# Patient Record
Sex: Female | Born: 1950 | Race: White | Hispanic: No | Marital: Married | Smoking: Former smoker
Health system: Southern US, Community
[De-identification: ages and names within clinical notes are randomized; demographics above are authoritative.]

## PROBLEM LIST (undated history)

## (undated) DIAGNOSIS — Z8489 Family history of other specified conditions: Secondary | ICD-10-CM

## (undated) DIAGNOSIS — Z8719 Personal history of other diseases of the digestive system: Secondary | ICD-10-CM

## (undated) DIAGNOSIS — K831 Obstruction of bile duct: Secondary | ICD-10-CM

## (undated) DIAGNOSIS — M1990 Unspecified osteoarthritis, unspecified site: Secondary | ICD-10-CM

## (undated) DIAGNOSIS — K219 Gastro-esophageal reflux disease without esophagitis: Secondary | ICD-10-CM

## (undated) DIAGNOSIS — K589 Irritable bowel syndrome without diarrhea: Secondary | ICD-10-CM

## (undated) HISTORY — PX: ABDOMINAL HYSTERECTOMY: SHX81

## (undated) HISTORY — PX: BACK SURGERY: SHX140

## (undated) HISTORY — PX: CHOLECYSTECTOMY: SHX55

---

## 2014-09-07 HISTORY — PX: JOINT REPLACEMENT: SHX530

## 2016-12-31 ENCOUNTER — Other Ambulatory Visit: Payer: Self-pay | Admitting: Orthopedic Surgery

## 2016-12-31 DIAGNOSIS — M7062 Trochanteric bursitis, left hip: Secondary | ICD-10-CM

## 2017-01-07 ENCOUNTER — Ambulatory Visit
Admission: RE | Admit: 2017-01-07 | Discharge: 2017-01-07 | Disposition: A | Discharge status: Home / Self Care | Payer: BLUE CROSS/BLUE SHIELD | Source: Ambulatory Visit | Attending: Orthopedic Surgery | Admitting: Orthopedic Surgery

## 2017-01-07 DIAGNOSIS — M7062 Trochanteric bursitis, left hip: Secondary | ICD-10-CM

## 2017-01-07 MED ORDER — METHYLPREDNISOLONE ACETATE 40 MG/ML INJ SUSP (RADIOLOG
120.0000 mg | Freq: Once | INTRAMUSCULAR | Status: AC
  Administered 2017-01-07: 120 mg via INTRA_ARTICULAR

## 2017-01-07 MED ORDER — DIAZEPAM 5 MG PO TABS
5.0000 mg | ORAL_TABLET | Freq: Once | ORAL | Status: AC
  Administered 2017-01-07: 5 mg via ORAL

## 2017-01-07 MED ORDER — IOPAMIDOL (ISOVUE-M 200) INJECTION 41%
1.0000 mL | Freq: Once | INTRAMUSCULAR | Status: AC
  Administered 2017-01-07: 1 mL via INTRA_ARTICULAR

## 2019-04-24 ENCOUNTER — Other Ambulatory Visit: Payer: Self-pay | Admitting: Orthopedic Surgery

## 2019-04-24 DIAGNOSIS — Z96642 Presence of left artificial hip joint: Secondary | ICD-10-CM

## 2019-04-28 ENCOUNTER — Other Ambulatory Visit: Payer: Self-pay

## 2019-04-28 ENCOUNTER — Encounter (HOSPITAL_COMMUNITY)
Admission: RE | Admit: 2019-04-28 | Discharge: 2019-04-28 | Disposition: A | Discharge status: Home / Self Care | Payer: Medicare Other | Source: Ambulatory Visit | Attending: Orthopedic Surgery | Admitting: Orthopedic Surgery

## 2019-04-28 DIAGNOSIS — Z96642 Presence of left artificial hip joint: Secondary | ICD-10-CM

## 2019-04-28 MED ORDER — TECHNETIUM TC 99M MEDRONATE IV KIT: 20.0000 | PACK | Freq: Once | INTRAVENOUS | Status: DC | PRN

## 2019-05-16 ENCOUNTER — Ambulatory Visit: Payer: Self-pay | Admitting: Orthopedic Surgery

## 2019-05-23 ENCOUNTER — Ambulatory Visit: Payer: Self-pay | Admitting: Orthopedic Surgery

## 2019-05-23 NOTE — H&P (View-Only) (Signed)
TOTAL HIP REVISION ADMISSION H&P  Patient is admitted for left revision total hip arthroplasty.  Subjective:  Chief Complaint: left hip pain  HPI: Gail Short, 68 y.o. female, has a history of pain and functional disability in the left hip due to arthritis and patient has failed non-surgical conservative treatments for greater than 12 weeks to include NSAID's and/or analgesics, flexibility and strengthening excercises, use of assistive devices and activity modification. The indications for the revision total hip arthroplasty are instability.  Onset of symptoms was abrupt starting 4 years ago with gradually worsening course since that time.  Prior procedures on the left hip include arthroplasty.  Patient currently rates pain in the left hip at 10 out of 10 with activity.  There is night pain, worsening of pain with activity and weight bearing, pain that interfers with activities of daily living and pain with passive range of motion.  This condition presents safety issues increasing the risk of falls.   There is no current active infection.  There are no active problems to display for this patient.  Past Medical History:  Diagnosis Date  . Arthritis    back  . Family history of adverse reaction to anesthesia    PONV  . GERD (gastroesophageal reflux disease)   . History of hiatal hernia   . Irritable bowel syndrome (IBS)   . Pancreatitis due to biliary obstruction     Past Surgical History:  Procedure Laterality Date  . ABDOMINAL HYSTERECTOMY    . BACK SURGERY     neck fusionsx2,[email or web address].lam L4-5,5-S1  . CHOLECYSTECTOMY    . JOINT REPLACEMENT Left 2016   hip    No current facility-administered medications for this visit.    No current outpatient medications on file.   Facility-Administered Medications Ordered in Other Visits  Medication Dose Route Frequency Provider Last Rate Last Dose  . 0.9 %  sodium chloride infusion   Intravenous Continuous Generoso Cropper, Aaron Edelman, MD      . acetaminophen  (OFIRMEV) 10 MG/ML IV           . acetaminophen (OFIRMEV) IV 1,000 mg  1,000 mg Intravenous To OR Torina Ey, Aaron Edelman, MD      . chlorhexidine (HIBICLENS) 4 % liquid 4 application  60 mL Topical Once Docia Klar, Aaron Edelman, MD      . chlorhexidine (HIBICLENS) 4 % liquid 4 application  60 mL Topical Once Paton Crum, Aaron Edelman, MD      . clindamycin (CLEOCIN) 900 MG/50ML IVPB           . clindamycin (CLEOCIN) IVPB 900 mg  900 mg Intravenous On Call to OR Javid Kemler, Aaron Edelman, MD      . dextrose 50 % solution           . lactated ringers infusion   Intravenous Continuous Catalina Gravel, MD 50 mL/hr at 05/31/19 0630    . lactated ringers infusion   Intravenous Continuous Brennan Bailey, MD 75 mL/hr at 05/31/19 [phone]    . povidone-iodine 10 % swab 2 application  2 application Topical Once Malaijah Houchen, Aaron Edelman, MD      . tranexamic acid (CYKLOKAPRON) 1000MG /149mL IVPB           . tranexamic acid (CYKLOKAPRON) IVPB 1,000 mg  1,000 mg Intravenous To OR Marketa Midkiff, Aaron Edelman, MD       Allergies  Allergen Reactions  . Penicillins Hives, Shortness Of Breath, Itching and Swelling    Did it involve swelling of the face/tongue/throat, SOB, or low BP? Yes Did it involve sudden  or severe rash/hives, skin peeling, or any reaction on the inside of your mouth or nose? No Did you need to seek medical attention at a hospital or doctor's office? Yes When did it last happen?30+ years ago If all above answers are "NO", may proceed with cephalosporin use.   . Meperidine And Related Other (See Comments)    Caused pt  violent reaction and loss of memory    Social History   Tobacco Use  . Smoking status: Former Smoker    Packs/day: 0.25    Years: 10.00    Pack years: 2.50    Types: Cigarettes    Quit date: 05/25/2009    Years since quitting: 10.0  . Smokeless tobacco: Never Used  Substance Use Topics  . Alcohol use: Never    Frequency: Never    No family history on file.    Review of Systems  Constitutional: Negative.    HENT: Negative.   Eyes: Negative.   Respiratory: Negative.   Cardiovascular: Negative.   Gastrointestinal: Positive for heartburn.  Genitourinary: Negative.   Musculoskeletal: Positive for joint pain.  Skin: Negative.   Endo/Heme/Allergies: Positive for environmental allergies.  Psychiatric/Behavioral: Negative.     Objective:  Physical Exam  Vitals reviewed. Constitutional: She is oriented to person, place, and time. She appears well-developed and well-nourished.  HENT:  Head: Normocephalic and atraumatic.  Eyes: Pupils are equal, round, and reactive to light. Conjunctivae and EOM are normal.  Neck: Normal range of motion. No thyromegaly present.  Cardiovascular: Normal rate, regular rhythm and intact distal pulses.  Respiratory: Effort normal. No respiratory distress.  GI: Soft. She exhibits no distension.  Genitourinary:    Genitourinary Comments: deferred   Musculoskeletal:     Left hip: She exhibits decreased range of motion.       Legs:  Neurological: She is alert and oriented to person, place, and time. She has normal reflexes.  Skin: Skin is warm and dry.  Psychiatric: She has a normal mood and affect. Her behavior is normal. Judgment and thought content normal.    Vital signs in last 24 hours: @VSRANGES @   Labs:   Estimated body mass index is 19.05 kg/m as calculated from the following:   Height as of 05/26/19: 5\' 6"  (1.676 m).   Weight as of 05/26/19: 53.5 kg.  Imaging Review:  Plain radiographs demonstrate severe degenerative joint disease of the left hip(s). There is evidence of vertical / anteverted inclination of the acetabular cup.The bone quality appears to be adequate for age and reported activity level.    Assessment/Plan:  End stage arthritis, left hip(s) with failed previous arthroplasty.  The patient history, physical examination, clinical judgement of the provider and imaging studies are consistent with end stage degenerative joint disease  of the left hip(s), previous total hip arthroplasty. Revision total hip arthroplasty is deemed medically necessary. The treatment options including medical management, injection therapy, arthroscopy and arthroplasty were discussed at length. The risks and benefits of total hip arthroplasty were presented and reviewed. The risks due to aseptic loosening, infection, stiffness, dislocation/subluxation,  thromboembolic complications and other imponderables were discussed.  The patient acknowledged the explanation, agreed to proceed with the plan and consent was signed. Patient is being admitted for inpatient treatment for surgery, pain control, PT, OT, prophylactic antibiotics, VTE prophylaxis, progressive ambulation and ADL's and discharge planning. The patient is planning to be discharged home with home health services

## 2019-05-23 NOTE — H&P (Signed)
TOTAL HIP REVISION ADMISSION H&P  Patient is admitted for left revision total hip arthroplasty.  Subjective:  Chief Complaint: left hip pain  HPI: Gail Short, 68 y.o. female, has a history of pain and functional disability in the left hip due to arthritis and patient has failed non-surgical conservative treatments for greater than 12 weeks to include NSAID's and/or analgesics, flexibility and strengthening excercises, use of assistive devices and activity modification. The indications for the revision total hip arthroplasty are instability.  Onset of symptoms was abrupt starting 4 years ago with gradually worsening course since that time.  Prior procedures on the left hip include arthroplasty.  Patient currently rates pain in the left hip at 10 out of 10 with activity.  There is night pain, worsening of pain with activity and weight bearing, pain that interfers with activities of daily living and pain with passive range of motion.  This condition presents safety issues increasing the risk of falls.   There is no current active infection.  There are no active problems to display for this patient.  Past Medical History:  Diagnosis Date  . Arthritis    back  . Family history of adverse reaction to anesthesia    PONV  . GERD (gastroesophageal reflux disease)   . History of hiatal hernia   . Irritable bowel syndrome (IBS)   . Pancreatitis due to biliary obstruction     Past Surgical History:  Procedure Laterality Date  . ABDOMINAL HYSTERECTOMY    . BACK SURGERY     neck fusionsx2,decom.lam L4-5,5-S1  . CHOLECYSTECTOMY    . JOINT REPLACEMENT Left 2016   hip    No current facility-administered medications for this visit.    No current outpatient medications on file.   Facility-Administered Medications Ordered in Other Visits  Medication Dose Route Frequency Provider Last Rate Last Dose  . 0.9 %  sodium chloride infusion   Intravenous Continuous Yecheskel Kurek, Aaron Edelman, MD      . acetaminophen  (OFIRMEV) 10 MG/ML IV           . acetaminophen (OFIRMEV) IV 1,000 mg  1,000 mg Intravenous To OR Maricarmen Braziel, Aaron Edelman, MD      . chlorhexidine (HIBICLENS) 4 % liquid 4 application  60 mL Topical Once Feather Berrie, Aaron Edelman, MD      . chlorhexidine (HIBICLENS) 4 % liquid 4 application  60 mL Topical Once Adison Jerger, Aaron Edelman, MD      . clindamycin (CLEOCIN) 900 MG/50ML IVPB           . clindamycin (CLEOCIN) IVPB 900 mg  900 mg Intravenous On Call to OR Maly Lemarr, Aaron Edelman, MD      . dextrose 50 % solution           . lactated ringers infusion   Intravenous Continuous Catalina Gravel, MD 50 mL/hr at 05/31/19 0630    . lactated ringers infusion   Intravenous Continuous Brennan Bailey, MD 75 mL/hr at 05/31/19 [phone]    . povidone-iodine 10 % swab 2 application  2 application Topical Once Blessyn Somm[place], Aaron Edelman, MD      . tranexamic acid (CYKLOKAPRON) 1000MG /149mL IVPB           . tranexamic acid (CYKLOKAPRON) IVPB 1,000 mg  1,000 mg Intravenous To OR Tipton Ballow, Aaron Edelman, MD       Allergies  Allergen Reactions  . Penicillins Hives, Shortness Of Breath, Itching and Swelling    Did it involve swelling of the face/tongue/throat, SOB, or low BP? Yes Did it involve sudden  or severe rash/hives, skin peeling, or any reaction on the inside of your mouth or nose? No Did you need to seek medical attention at a hospital or doctor's office? Yes When did it last happen?30+ years ago If all above answers are "NO", may proceed with cephalosporin use.   . Meperidine And Related Other (See Comments)    Caused pt  violent reaction and loss of memory    Social History   Tobacco Use  . Smoking status: Former Smoker    Packs/day: 0.25    Years: 10.00    Pack years: 2.50    Types: Cigarettes    Quit date: 05/25/2009    Years since quitting: 10.0  . Smokeless tobacco: Never Used  Substance Use Topics  . Alcohol use: Never    Frequency: Never    No family history on file.    Review of Systems  Constitutional: Negative.    HENT: Negative.   Eyes: Negative.   Respiratory: Negative.   Cardiovascular: Negative.   Gastrointestinal: Positive for heartburn.  Genitourinary: Negative.   Musculoskeletal: Positive for joint pain.  Skin: Negative.   Endo/Heme/Allergies: Positive for environmental allergies.  Psychiatric/Behavioral: Negative.     Objective:  Physical Exam  Vitals reviewed. Constitutional: She is oriented to person, place, and time. She appears well-developed and well-nourished.  HENT:  Head: Normocephalic and atraumatic.  Eyes: Pupils are equal, round, and reactive to light. Conjunctivae and EOM are normal.  Neck: Normal range of motion. No thyromegaly present.  Cardiovascular: Normal rate, regular rhythm and intact distal pulses.  Respiratory: Effort normal. No respiratory distress.  GI: Soft. She exhibits no distension.  Genitourinary:    Genitourinary Comments: deferred   Musculoskeletal:     Left hip: She exhibits decreased range of motion.       Legs:  Neurological: She is alert and oriented to person, place, and time. She has normal reflexes.  Skin: Skin is warm and dry.  Psychiatric: She has a normal mood and affect. Her behavior is normal. Judgment and thought content normal.    Vital signs in last 24 hours: @VSRANGES@   Labs:   Estimated body mass index is 19.05 kg/m as calculated from the following:   Height as of 05/26/19: 5' 6" (1.676 m).   Weight as of 05/26/19: 53.5 kg.  Imaging Review:  Plain radiographs demonstrate severe degenerative joint disease of the left hip(s). There is evidence of vertical / anteverted inclination of the acetabular cup.The bone quality appears to be adequate for age and reported activity level.    Assessment/Plan:  End stage arthritis, left hip(s) with failed previous arthroplasty.  The patient history, physical examination, clinical judgement of the provider and imaging studies are consistent with end stage degenerative joint disease  of the left hip(s), previous total hip arthroplasty. Revision total hip arthroplasty is deemed medically necessary. The treatment options including medical management, injection therapy, arthroscopy and arthroplasty were discussed at length. The risks and benefits of total hip arthroplasty were presented and reviewed. The risks due to aseptic loosening, infection, stiffness, dislocation/subluxation,  thromboembolic complications and other imponderables were discussed.  The patient acknowledged the explanation, agreed to proceed with the plan and consent was signed. Patient is being admitted for inpatient treatment for surgery, pain control, PT, OT, prophylactic antibiotics, VTE prophylaxis, progressive ambulation and ADL's and discharge planning. The patient is planning to be discharged home with home health services 

## 2019-05-24 NOTE — Patient Instructions (Addendum)
DUE TO COVID-19 ONLY ONE VISITOR IS ALLOWED TO COME WITH YOU AND STAY IN THE WAITING ROOM ONLY DURING PRE OP AND PROCEDURE DAY OF SURGERY.  THE 1 VISITOR MAY VISIT WITH YOU AFTER SURGERY IN YOUR PRIVATE ROOM DURING VISITING HOURS ONLY!     YOU NEED TO HAVE A COVID 19 TEST ON_Sat. 9/19______ @_10 :20______, THIS TEST MUST BE DONE BEFORE SURGERY, COME  [address], [place] , [id number].  ([place])  ONCE YOUR COVID TEST IS COMPLETED, PLEASE BEGIN THE QUARANTINE INSTRUCTIONS AS OUTLINED IN YOUR HANDOUT.                Estill Bamberg   Your procedure is scheduled on: Wednesday 05/31/19   Report to [facility] Main  Entrance   Report to Short Stay at 5:30 AM     Call this number if you have problems the morning of surgery Platter, NO CHEWING GUM CANDY OR MINTS.    Do not eat food After Midnight.   YOU MAY HAVE CLEAR LIQUIDS FROM MIDNIGHT UNTIL 4:30AM.  At 4:30AM Please finish the prescribed Pre-Surgery  drink.  Nothing by mouth after you finish the  drink !   Take these medicines the morning of surgery with A SIP OF WATER: none                                 You may not have any metal on your body including hair pins and              piercings              Do not wear jewelry, make-up, lotions, powders or perfumes, deodorant             Do not wear nail polish.  Do not shave  48 hours prior to surgery.            Do not bring valuables to the hospital. [place].  Contacts, dentures or bridgework may not be worn into surgery.        Name and phone number of your driver:  Special Instructions: N/A              Please read over the following fact sheets you were given: _____________________________________________________________________             [facility] - Preparing for Surgery  Before surgery, you can play an  important role.   Because skin is not sterile, your skin needs to be as free of germs as possible .  You can reduce the number of germs on your skin by washing with CHG (chlorahexidine gluconate) soap before surgery.   CHG is an antiseptic cleaner which kills germs and bonds with the skin to continue killing germs even after washing. Please DO NOT use if you have an allergy to CHG or antibacterial soaps.   If your skin becomes reddened/irritated stop using the CHG and inform your nurse when you arrive at Short Stay. Do not shave (including legs and underarms) for at least 48 hours prior to the first CHG shower.   Please follow these instructions carefully:  1.  Shower with CHG Soap the night before surgery and the  morning of  Surgery.  2.  If you choose to wash your hair, wash your hair first as usual with your  normal  shampoo.  3.  After you shampoo, rinse your hair and body thoroughly to remove the  shampoo.                                        4.  Use CHG as you would any other liquid soap.  You can apply chg directly  to the skin and wash                       Gently with a scrungie or clean washcloth.  5.  Apply the CHG Soap to your body ONLY FROM THE NECK DOWN.   Do not use on face/ open                           Wound or open sores. Avoid contact with eyes, ears mouth and genitals (private parts).                       Wash face,  Genitals (private parts) with your normal soap.             6.  Wash thoroughly, paying special attention to the area where your surgery  will be performed.  7.  Thoroughly rinse your body with warm water from the neck down.  8.  DO NOT shower/wash with your normal soap after using and rinsing off  the CHG Soap.             9.  Pat yourself dry with a clean towel.            10.  Wear clean pajamas.            11.  Place clean sheets on your bed the night of your first shower and do not  sleep with pets. Day of Surgery : Do not apply any lotions/deodorants  the morning of surgery.  Please wear clean clothes to the hospital/surgery center.  FAILURE TO FOLLOW THESE INSTRUCTIONS MAY RESULT IN THE CANCELLATION OF YOUR SURGERY PATIENT SIGNATURE_________________________________  NURSE SIGNATURE__________________________________  ________________________________________________________________________   Rogelia MireIncentive Spirometer  An incentive spirometer is a tool that can help keep your lungs clear and active. This tool measures how well you are filling your lungs with each breath. Taking long deep breaths may help reverse or decrease the chance of developing breathing (pulmonary) problems (especially infection) following:  A long period of time when you are unable to move or be active. BEFORE THE PROCEDURE   If the spirometer includes an indicator to show your best effort, your nurse or respiratory therapist will set it to a desired goal.  If possible, sit up straight or lean slightly forward. Try not to slouch.  Hold the incentive spirometer in an upright position. INSTRUCTIONS FOR USE  1. Sit on the edge of your bed if possible, or sit up as far as you can in bed or on a chair. 2. Hold the incentive spirometer in an upright position. 3. Breathe out normally. 4. Place the mouthpiece in your mouth and seal your lips tightly around it. 5. Breathe in slowly and as deeply as possible, raising the piston or the ball toward the top of the column. 6. Hold your breath for  3-5 seconds or for as long as possible. Allow the piston or ball to fall to the bottom of the column. 7. Remove the mouthpiece from your mouth and breathe out normally. 8. Rest for a few seconds and repeat Steps 1 through 7 at least 10 times every 1-2 hours when you are awake. Take your time and take a few normal breaths between deep breaths. 9. The spirometer may include an indicator to show your best effort. Use the indicator as a goal to work toward during each repetition. 10. After  each set of 10 deep breaths, practice coughing to be sure your lungs are clear. If you have an incision (the cut made at the time of surgery), support your incision when coughing by placing a pillow or rolled up towels firmly against it. Once you are able to get out of bed, walk around indoors and cough well. You may stop using the incentive spirometer when instructed by your caregiver.  RISKS AND COMPLICATIONS  Take your time so you do not get dizzy or light-headed.  If you are in pain, you may need to take or ask for pain medication before doing incentive spirometry. It is harder to take a deep breath if you are having pain. AFTER USE  Rest and breathe slowly and easily.  It can be helpful to keep track of a log of your progress. Your caregiver can provide you with a simple table to help with this. If you are using the spirometer at home, follow these instructions: SEEK MEDICAL CARE IF:   You are having difficultly using the spirometer.  You have trouble using the spirometer as often as instructed.  Your pain medication is not giving enough relief while using the spirometer.  You develop fever of 100.5 F (38.1 C) or higher. SEEK IMMEDIATE MEDICAL CARE IF:   You cough up bloody sputum that had not been present before.  You develop fever of 102 F (38.9 C) or greater.  You develop worsening pain at or near the incision site. MAKE SURE YOU:   Understand these instructions.  Will watch your condition.  Will get help right away if you are not doing well or get worse. Document Released: 01/04/2007 Document Revised: 11/16/2011 Document Reviewed: 03/07/2007 ExitCare Patient Information 2014 ExitCare, [place].   ________________________________________________________________________  WHAT IS A BLOOD TRANSFUSION? Blood Transfusion Information  A transfusion is the replacement of blood or some of its parts. Blood is made up of multiple cells which provide different  functions.  Red blood cells carry oxygen and are used for blood loss replacement.  White blood cells fight against infection.  Platelets control bleeding.  Plasma helps clot blood.  Other blood products are available for specialized needs, such as hemophilia or other clotting disorders. BEFORE THE TRANSFUSION  Who gives blood for transfusions?   Healthy volunteers who are fully evaluated to make sure their blood is safe. This is blood bank blood. Transfusion therapy is the safest it has ever been in the practice of medicine. Before blood is taken from a donor, a complete history is taken to make sure that person has no history of diseases nor engages in risky social behavior (examples are intravenous drug use or sexual activity with multiple partners). The donor's travel history is screened to minimize risk of transmitting infections, such as malaria. The donated blood is tested for signs of infectious diseases, such as HIV and hepatitis. The blood is then tested to be sure it is compatible with you in  order to minimize the chance of a transfusion reaction. If you or a relative donates blood, this is often done in anticipation of surgery and is not appropriate for emergency situations. It takes many days to process the donated blood. RISKS AND COMPLICATIONS Although transfusion therapy is very safe and saves many lives, the main dangers of transfusion include:   Getting an infectious disease.  Developing a transfusion reaction. This is an allergic reaction to something in the blood you were given. Every precaution is taken to prevent this. The decision to have a blood transfusion has been considered carefully by your caregiver before blood is given. Blood is not given unless the benefits outweigh the risks. AFTER THE TRANSFUSION  Right after receiving a blood transfusion, you will usually feel much better and more energetic. This is especially true if your red blood cells have gotten low  (anemic). The transfusion raises the level of the red blood cells which carry oxygen, and this usually causes an energy increase.  The nurse administering the transfusion will monitor you carefully for complications. HOME CARE INSTRUCTIONS  No special instructions are needed after a transfusion. You may find your energy is better. Speak with your caregiver about any limitations on activity for underlying diseases you may have. SEEK MEDICAL CARE IF:   Your condition is not improving after your transfusion.  You develop redness or irritation at the intravenous (IV) site. SEEK IMMEDIATE MEDICAL CARE IF:  Any of the following symptoms occur over the next 12 hours:  Shaking chills.  You have a temperature by mouth above 102 F (38.9 C), not controlled by medicine.  Chest, back, or muscle pain.  People around you feel you are not acting correctly or are confused.  Shortness of breath or difficulty breathing.  Dizziness and fainting.  You get a rash or develop hives.  You have a decrease in urine output.  Your urine turns a dark color or changes to pink, red, or brown. Any of the following symptoms occur over the next 10 days:  You have a temperature by mouth above 102 F (38.9 C), not controlled by medicine.  Shortness of breath.  Weakness after normal activity.  The white part of the eye turns yellow (jaundice).  You have a decrease in the amount of urine or are urinating less often.  Your urine turns a dark color or changes to pink, red, or brown. Document Released: 08/21/2000 Document Revised: 11/16/2011 Document Reviewed: 04/09/2008 [facility] LLCExitCare Patient Information 2014 [place], [place].  _______________________________________________________________________

## 2019-05-26 ENCOUNTER — Encounter (HOSPITAL_COMMUNITY)
Admission: RE | Admit: 2019-05-26 | Discharge: 2019-05-26 | Disposition: A | Discharge status: Home / Self Care | Payer: Medicare Other | Source: Ambulatory Visit | Attending: Orthopedic Surgery | Admitting: Orthopedic Surgery

## 2019-05-26 ENCOUNTER — Other Ambulatory Visit: Payer: Self-pay

## 2019-05-26 ENCOUNTER — Encounter (INDEPENDENT_AMBULATORY_CARE_PROVIDER_SITE_OTHER): Payer: Self-pay

## 2019-05-26 ENCOUNTER — Encounter (HOSPITAL_COMMUNITY): Payer: Self-pay

## 2019-05-26 DIAGNOSIS — Z01812 Encounter for preprocedural laboratory examination: Secondary | ICD-10-CM | POA: Diagnosis not present

## 2019-05-26 HISTORY — DX: Gastro-esophageal reflux disease without esophagitis: K21.9

## 2019-05-26 HISTORY — DX: Irritable bowel syndrome without diarrhea: K58.9

## 2019-05-26 HISTORY — DX: Unspecified osteoarthritis, unspecified site: M19.90

## 2019-05-26 HISTORY — DX: Obstruction of bile duct: K83.1

## 2019-05-26 HISTORY — DX: Personal history of other diseases of the digestive system: Z87.19

## 2019-05-26 HISTORY — DX: Family history of other specified conditions: Z84.89

## 2019-05-26 LAB — COMPREHENSIVE METABOLIC PANEL
ALT: 12 U/L (ref 0–44)
AST: 25 U/L (ref 15–41)
Albumin: 4.1 g/dL (ref 3.5–5.0)
Alkaline Phosphatase: 64 U/L (ref 38–126)
Anion gap: 9 (ref 5–15)
BUN: 10 mg/dL (ref 8–23)
CO2: 28 mmol/L (ref 22–32)
Calcium: 9.4 mg/dL (ref 8.9–10.3)
Chloride: 100 mmol/L (ref 98–111)
Creatinine, Ser: 0.55 mg/dL (ref 0.44–1.00)
GFR calc Af Amer: >60 mL/min (ref >60)
GFR calc non Af Amer: >60 mL/min (ref >60)
Glucose, Bld: 84 mg/dL (ref 70–99)
Potassium: 5.8 mmol/L — ABNORMAL HIGH (ref 3.5–5.1)
Sodium: 137 mmol/L (ref 135–145)
Total Bilirubin: 0.4 mg/dL (ref 0.3–1.2)
Total Protein: 7 g/dL (ref 6.5–8.1)

## 2019-05-26 LAB — URINALYSIS, ROUTINE W REFLEX MICROSCOPIC
Bilirubin Urine: NEGATIVE
Glucose, UA: NEGATIVE mg/dL
Hgb urine dipstick: NEGATIVE
Ketones, ur: 5 mg/dL — AB
Leukocytes,Ua: NEGATIVE
Nitrite: NEGATIVE
Protein, ur: NEGATIVE mg/dL
Specific Gravity, Urine: 1.015 (ref 1.005–1.030)
pH: 5 (ref 5.0–8.0)

## 2019-05-26 LAB — CBC
HCT: 38.5 % (ref 36.0–46.0)
Hemoglobin: 12.4 g/dL (ref 12.0–15.0)
MCH: 33.1 pg (ref 26.0–34.0)
MCHC: 32.2 g/dL (ref 30.0–36.0)
MCV: 102.7 fL — ABNORMAL HIGH (ref 80.0–100.0)
Platelets: 285 10*3/uL (ref 150–400)
RBC: 3.75 MIL/uL — ABNORMAL LOW (ref 3.87–5.11)
RDW: 12.9 % (ref 11.5–15.5)
WBC: 6.2 10*3/uL (ref 4.0–10.5)
nRBC: 0 % (ref 0.0–0.2)

## 2019-05-26 LAB — PROTIME-INR
INR: 0.9 (ref 0.8–1.2)
Prothrombin Time: 12.4 seconds (ref 11.4–15.2)

## 2019-05-26 LAB — ABO/RH: ABO/RH(D): O POS

## 2019-05-26 LAB — SURGICAL PCR SCREEN
MRSA, PCR: NEGATIVE
Staphylococcus aureus: NEGATIVE

## 2019-05-26 NOTE — Progress Notes (Addendum)
PCP - Dr Kellie Shropshire Cardiologist - none  Chest x-ray - no EKG - no Stress Test - no ECHO - no Cardiac Cath - no  Sleep Study - no CPAP - no  Fasting Blood Sugar - NA Checks Blood Sugar _____ times a day  Blood Thinner Instructions:NA Aspirin Instructions: Last Dose:supplements stopped 05/19/19  Anesthesia review:   Patient denies shortness of breath, fever, cough and chest pain at PAT appointment yes  Patient verbalized understanding of instructions that were given to them at the PAT appointment. Patient was also instructed that they will need to review over the PAT instructions again at home before surgery. Yes  K+ was 5.8 at PAT visit . Chart given to [facility] .

## 2019-05-27 ENCOUNTER — Other Ambulatory Visit (HOSPITAL_COMMUNITY)
Admission: RE | Admit: 2019-05-27 | Discharge: 2019-05-27 | Disposition: A | Discharge status: Home / Self Care | Payer: Medicare Other | Source: Ambulatory Visit | Attending: Orthopedic Surgery | Admitting: Orthopedic Surgery

## 2019-05-27 DIAGNOSIS — Z01812 Encounter for preprocedural laboratory examination: Secondary | ICD-10-CM | POA: Insufficient documentation

## 2019-05-27 DIAGNOSIS — Z20828 Contact with and (suspected) exposure to other viral communicable diseases: Secondary | ICD-10-CM | POA: Insufficient documentation

## 2019-05-28 LAB — NOVEL CORONAVIRUS, NAA (HOSP ORDER, SEND-OUT TO REF LAB; TAT 18-24 HRS): SARS-CoV-2, NAA: NOT DETECTED

## 2019-05-30 NOTE — Anesthesia Preprocedure Evaluation (Addendum)
Anesthesia Evaluation  Patient identified by MRN, date of birth, ID band Patient awake    Reviewed: Allergy & Precautions, NPO status , Patient's Chart, lab work & pertinent test results  History of Anesthesia Complications Negative for: history of anesthetic complications  Airway Mallampati: II  TM Distance: <3 FB Neck ROM: Full    Dental  (+) Missing,    Pulmonary former smoker,    Pulmonary exam normal        Cardiovascular negative cardio ROS Normal cardiovascular exam     Neuro/Psych negative neurological ROS  negative psych ROS   GI/Hepatic Neg liver ROS, hiatal hernia, GERD  Controlled,  Endo/Other  negative endocrine ROS  Renal/GU negative Renal ROS  negative genitourinary   Musculoskeletal  (+) Arthritis , Failed left THA   Abdominal   Peds  Hematology negative hematology ROS (+)   Anesthesia Other Findings Day of surgery medications reviewed with patient.  Reproductive/Obstetrics negative OB ROS                            Anesthesia Physical Anesthesia Plan  ASA: II  Anesthesia Plan: General   Post-op Pain Management:    Induction: Intravenous  PONV Risk Score and Plan: 4 or greater and Treatment may vary due to age or medical condition, Ondansetron, Dexamethasone and Midazolam  Airway Management Planned: Oral ETT  Additional Equipment: None  Intra-op Plan:   Post-operative Plan: Extubation in OR  Informed Consent: I have reviewed the patients History and Physical, chart, labs and discussed the procedure including the risks, benefits and alternatives for the proposed anesthesia with the patient or authorized representative who has indicated his/her understanding and acceptance.     Dental advisory given  Plan Discussed with: CRNA  Anesthesia Plan Comments:        Anesthesia Quick Evaluation

## 2019-05-31 ENCOUNTER — Other Ambulatory Visit: Payer: Self-pay

## 2019-05-31 ENCOUNTER — Inpatient Hospital Stay (HOSPITAL_COMMUNITY): Payer: Medicare Other | Admitting: Physician Assistant

## 2019-05-31 ENCOUNTER — Encounter (HOSPITAL_COMMUNITY)
Admission: RE | Disposition: A | Discharge status: Home Health | Payer: Self-pay | Source: Home / Self Care | Attending: Orthopedic Surgery

## 2019-05-31 ENCOUNTER — Inpatient Hospital Stay (HOSPITAL_COMMUNITY): Payer: Medicare Other | Admitting: Certified Registered Nurse Anesthetist

## 2019-05-31 ENCOUNTER — Inpatient Hospital Stay (HOSPITAL_COMMUNITY): Payer: Medicare Other

## 2019-05-31 ENCOUNTER — Encounter (HOSPITAL_COMMUNITY): Payer: Self-pay

## 2019-05-31 ENCOUNTER — Inpatient Hospital Stay (HOSPITAL_COMMUNITY)
Admission: RE | Admit: 2019-05-31 | Discharge: 2019-06-02 | DRG: 468 | Disposition: A | Discharge status: Home Health | Payer: Medicare Other | Attending: Orthopedic Surgery | Admitting: Orthopedic Surgery

## 2019-05-31 DIAGNOSIS — T84091A Other mechanical complication of internal left hip prosthesis, initial encounter: Secondary | ICD-10-CM | POA: Diagnosis not present

## 2019-05-31 DIAGNOSIS — Y792 Prosthetic and other implants, materials and accessory orthopedic devices associated with adverse incidents: Secondary | ICD-10-CM | POA: Diagnosis present

## 2019-05-31 DIAGNOSIS — T84018A Broken internal joint prosthesis, other site, initial encounter: Secondary | ICD-10-CM

## 2019-05-31 DIAGNOSIS — Z96649 Presence of unspecified artificial hip joint: Secondary | ICD-10-CM

## 2019-05-31 DIAGNOSIS — K589 Irritable bowel syndrome without diarrhea: Secondary | ICD-10-CM | POA: Diagnosis not present

## 2019-05-31 DIAGNOSIS — Z87891 Personal history of nicotine dependence: Secondary | ICD-10-CM | POA: Diagnosis not present

## 2019-05-31 DIAGNOSIS — Z880 Allergy status to penicillin: Secondary | ICD-10-CM | POA: Diagnosis not present

## 2019-05-31 DIAGNOSIS — Z90710 Acquired absence of both cervix and uterus: Secondary | ICD-10-CM | POA: Diagnosis not present

## 2019-05-31 DIAGNOSIS — M1612 Unilateral primary osteoarthritis, left hip: Secondary | ICD-10-CM | POA: Diagnosis not present

## 2019-05-31 DIAGNOSIS — Z419 Encounter for procedure for purposes other than remedying health state, unspecified: Secondary | ICD-10-CM

## 2019-05-31 DIAGNOSIS — Z09 Encounter for follow-up examination after completed treatment for conditions other than malignant neoplasm: Secondary | ICD-10-CM

## 2019-05-31 DIAGNOSIS — K219 Gastro-esophageal reflux disease without esophagitis: Secondary | ICD-10-CM | POA: Diagnosis present

## 2019-05-31 HISTORY — PX: TOTAL HIP REVISION: SHX763

## 2019-05-31 LAB — TYPE AND SCREEN
ABO/RH(D): O POS
Antibody Screen: NEGATIVE

## 2019-05-31 LAB — BASIC METABOLIC PANEL
Anion gap: 6 (ref 5–15)
BUN: 8 mg/dL (ref 8–23)
CO2: 26 mmol/L (ref 22–32)
Calcium: 9 mg/dL (ref 8.9–10.3)
Chloride: 101 mmol/L (ref 98–111)
Creatinine, Ser: 0.45 mg/dL (ref 0.44–1.00)
GFR calc Af Amer: >60 mL/min (ref >60)
GFR calc non Af Amer: >60 mL/min (ref >60)
Glucose, Bld: 53 mg/dL — ABNORMAL LOW (ref 70–99)
Potassium: 4 mmol/L (ref 3.5–5.1)
Sodium: 133 mmol/L — ABNORMAL LOW (ref 135–145)

## 2019-05-31 LAB — GLUCOSE, CAPILLARY: Glucose-Capillary: 158 mg/dL — ABNORMAL HIGH (ref 70–99)

## 2019-05-31 SURGERY — TOTAL HIP REVISION
Anesthesia: General | Site: Hip | Laterality: Left

## 2019-05-31 MED ORDER — MENTHOL 3 MG MT LOZG: 1.0000 | LOZENGE | OROMUCOSAL | Status: DC | PRN

## 2019-05-31 MED ORDER — BUPIVACAINE-EPINEPHRINE 0.5% -1:200000 IJ SOLN
INTRAMUSCULAR | Status: AC
  Filled 2019-05-31: qty 1

## 2019-05-31 MED ORDER — VANCOMYCIN HCL IN DEXTROSE 1-5 GM/200ML-% IV SOLN
1000.0000 mg | Freq: Two times a day (BID) | INTRAVENOUS | Status: AC
  Administered 2019-05-31: 18:00:00 1000 mg via INTRAVENOUS
  Filled 2019-05-31: qty 200

## 2019-05-31 MED ORDER — PROMETHAZINE HCL 25 MG/ML IJ SOLN
6.2500 mg | INTRAMUSCULAR | Status: DC | PRN
  Administered 2019-05-31: 6.25 mg via INTRAVENOUS

## 2019-05-31 MED ORDER — PHENYLEPHRINE 40 MCG/ML (10ML) SYRINGE FOR IV PUSH (FOR BLOOD PRESSURE SUPPORT)
PREFILLED_SYRINGE | INTRAVENOUS | Status: DC | PRN
  Administered 2019-05-31: 80 ug via INTRAVENOUS
  Administered 2019-05-31: 120 ug via INTRAVENOUS
  Administered 2019-05-31 (×2): 80 ug via INTRAVENOUS
  Administered 2019-05-31: 120 ug via INTRAVENOUS

## 2019-05-31 MED ORDER — ALUM & MAG HYDROXIDE-SIMETH 200-200-20 MG/5ML PO SUSP: 30.0000 mL | ORAL | Status: DC | PRN

## 2019-05-31 MED ORDER — MECLIZINE HCL 25 MG PO TABS: 25.0000 mg | ORAL_TABLET | Freq: Three times a day (TID) | ORAL | Status: DC | PRN

## 2019-05-31 MED ORDER — SENNA 8.6 MG PO TABS
1.0000 | ORAL_TABLET | Freq: Two times a day (BID) | ORAL | Status: DC
  Administered 2019-05-31 – 2019-06-02 (×4): 8.6 mg via ORAL
  Filled 2019-05-31 (×5): qty 1

## 2019-05-31 MED ORDER — METHOCARBAMOL 500 MG IVPB - SIMPLE MED
500.0000 mg | Freq: Four times a day (QID) | INTRAVENOUS | Status: DC | PRN
  Administered 2019-05-31: 500 mg via INTRAVENOUS
  Filled 2019-05-31: qty 50

## 2019-05-31 MED ORDER — OXYCODONE HCL 5 MG PO TABS
ORAL_TABLET | ORAL | Status: AC
  Filled 2019-05-31: qty 1

## 2019-05-31 MED ORDER — BIOTIN 1000 MCG PO TABS: 1000.0000 ug | ORAL_TABLET | Freq: Every day | ORAL | Status: DC

## 2019-05-31 MED ORDER — SODIUM CHLORIDE 0.9 % IR SOLN
Status: DC | PRN
  Administered 2019-05-31: 1000 mL
  Administered 2019-05-31: 3000 mL
  Administered 2019-05-31: 1000 mL

## 2019-05-31 MED ORDER — PHENOL 1.4 % MT LIQD: 1.0000 | OROMUCOSAL | Status: DC | PRN

## 2019-05-31 MED ORDER — POVIDONE-IODINE 10 % EX SWAB: 2.0000 "application " | Freq: Once | CUTANEOUS | Status: DC

## 2019-05-31 MED ORDER — TRANEXAMIC ACID-NACL 1000-0.7 MG/100ML-% IV SOLN
1000.0000 mg | INTRAVENOUS | Status: AC
  Administered 2019-05-31: 1000 mg via INTRAVENOUS

## 2019-05-31 MED ORDER — DEXAMETHASONE SODIUM PHOSPHATE 10 MG/ML IJ SOLN
10.0000 mg | Freq: Once | INTRAMUSCULAR | Status: AC
  Administered 2019-06-01: 10 mg via INTRAVENOUS
  Filled 2019-05-31: qty 1

## 2019-05-31 MED ORDER — PHENYLEPHRINE 40 MCG/ML (10ML) SYRINGE FOR IV PUSH (FOR BLOOD PRESSURE SUPPORT)
PREFILLED_SYRINGE | INTRAVENOUS | Status: AC
  Filled 2019-05-31: qty 10

## 2019-05-31 MED ORDER — LACTATED RINGERS IV SOLN
INTRAVENOUS | Status: DC
  Administered 2019-05-31: 07:00:00 via INTRAVENOUS

## 2019-05-31 MED ORDER — CHLORHEXIDINE GLUCONATE 4 % EX LIQD: 60.0000 mL | Freq: Once | CUTANEOUS | Status: DC

## 2019-05-31 MED ORDER — MIDAZOLAM HCL 5 MG/5ML IJ SOLN
INTRAMUSCULAR | Status: DC | PRN
  Administered 2019-05-31 (×2): 1 mg via INTRAVENOUS

## 2019-05-31 MED ORDER — SODIUM CHLORIDE 0.9 % IV SOLN
INTRAVENOUS | Status: DC | PRN
  Administered 2019-05-31: 20 ug/min via INTRAVENOUS

## 2019-05-31 MED ORDER — FENTANYL CITRATE (PF) 250 MCG/5ML IJ SOLN
INTRAMUSCULAR | Status: AC
  Filled 2019-05-31: qty 5

## 2019-05-31 MED ORDER — DOCUSATE SODIUM 100 MG PO CAPS
100.0000 mg | ORAL_CAPSULE | Freq: Two times a day (BID) | ORAL | Status: DC
  Administered 2019-05-31 – 2019-06-02 (×4): 100 mg via ORAL
  Filled 2019-05-31 (×5): qty 1

## 2019-05-31 MED ORDER — TRANEXAMIC ACID-NACL 1000-0.7 MG/100ML-% IV SOLN
INTRAVENOUS | Status: AC
  Filled 2019-05-31: qty 100

## 2019-05-31 MED ORDER — PROPOFOL 10 MG/ML IV BOLUS
INTRAVENOUS | Status: DC | PRN
  Administered 2019-05-31: 100 mg via INTRAVENOUS

## 2019-05-31 MED ORDER — LIDOCAINE 2% (20 MG/ML) 5 ML SYRINGE
INTRAMUSCULAR | Status: DC | PRN
  Administered 2019-05-31: 20 mg via INTRAVENOUS
  Administered 2019-05-31: 40 mg via INTRAVENOUS

## 2019-05-31 MED ORDER — VANCOMYCIN HCL IN DEXTROSE 1-5 GM/200ML-% IV SOLN
1000.0000 mg | INTRAVENOUS | Status: AC
  Administered 2019-05-31: 07:00:00 1000 mg via INTRAVENOUS

## 2019-05-31 MED ORDER — PROMETHAZINE HCL 25 MG/ML IJ SOLN
INTRAMUSCULAR | Status: AC
  Filled 2019-05-31: qty 1

## 2019-05-31 MED ORDER — HYDROMORPHONE HCL 1 MG/ML IJ SOLN
INTRAMUSCULAR | Status: AC
  Administered 2019-05-31: 0.5 mg via INTRAVENOUS
  Filled 2019-05-31: qty 1

## 2019-05-31 MED ORDER — ACETAMINOPHEN 325 MG PO TABS: 325.0000 mg | ORAL_TABLET | Freq: Four times a day (QID) | ORAL | Status: DC | PRN

## 2019-05-31 MED ORDER — HYDROCODONE-ACETAMINOPHEN 7.5-325 MG PO TABS: 1.0000 | ORAL_TABLET | ORAL | Status: DC | PRN

## 2019-05-31 MED ORDER — CLINDAMYCIN PHOSPHATE 900 MG/50ML IV SOLN
900.0000 mg | INTRAVENOUS | Status: AC
  Administered 2019-05-31: 900 mg via INTRAVENOUS

## 2019-05-31 MED ORDER — DEXAMETHASONE SODIUM PHOSPHATE 10 MG/ML IJ SOLN
INTRAMUSCULAR | Status: AC
  Filled 2019-05-31: qty 1

## 2019-05-31 MED ORDER — SUGAMMADEX SODIUM 200 MG/2ML IV SOLN
INTRAVENOUS | Status: DC | PRN
  Administered 2019-05-31: 150 mg via INTRAVENOUS

## 2019-05-31 MED ORDER — ACETAMINOPHEN 10 MG/ML IV SOLN
INTRAVENOUS | Status: AC
  Filled 2019-05-31: qty 100

## 2019-05-31 MED ORDER — METHOCARBAMOL 500 MG PO TABS
500.0000 mg | ORAL_TABLET | Freq: Four times a day (QID) | ORAL | Status: DC | PRN
  Administered 2019-06-01 – 2019-06-02 (×4): 500 mg via ORAL
  Filled 2019-05-31 (×5): qty 1

## 2019-05-31 MED ORDER — KETOROLAC TROMETHAMINE 30 MG/ML IJ SOLN
INTRAMUSCULAR | Status: AC
  Filled 2019-05-31: qty 1

## 2019-05-31 MED ORDER — ACETAMINOPHEN 500 MG PO TABS
1000.0000 mg | ORAL_TABLET | Freq: Once | ORAL | Status: AC
  Administered 2019-05-31: 1000 mg via ORAL
  Filled 2019-05-31: qty 2

## 2019-05-31 MED ORDER — SODIUM CHLORIDE 0.9 % IV SOLN
INTRAVENOUS | Status: DC
  Administered 2019-05-31: 1000 mL via INTRAVENOUS
  Administered 2019-05-31 – 2019-06-01 (×2): via INTRAVENOUS

## 2019-05-31 MED ORDER — SODIUM CHLORIDE 0.9 % IV SOLN: INTRAVENOUS | Status: DC

## 2019-05-31 MED ORDER — ONDANSETRON HCL 4 MG/2ML IJ SOLN
INTRAMUSCULAR | Status: DC | PRN
  Administered 2019-05-31: 4 mg via INTRAVENOUS

## 2019-05-31 MED ORDER — METOCLOPRAMIDE HCL 5 MG/ML IJ SOLN: 5.0000 mg | Freq: Three times a day (TID) | INTRAMUSCULAR | Status: DC | PRN

## 2019-05-31 MED ORDER — SODIUM CHLORIDE 0.9% FLUSH
INTRAVENOUS | Status: DC | PRN
  Administered 2019-05-31: 30 mL

## 2019-05-31 MED ORDER — DIPHENHYDRAMINE HCL 12.5 MG/5ML PO ELIX: 12.5000 mg | ORAL_SOLUTION | ORAL | Status: DC | PRN

## 2019-05-31 MED ORDER — ISOPROPYL ALCOHOL SOLN
Status: DC | PRN
  Administered 2019-05-31: 100 mL

## 2019-05-31 MED ORDER — METOCLOPRAMIDE HCL 5 MG PO TABS: 5.0000 mg | ORAL_TABLET | Freq: Three times a day (TID) | ORAL | Status: DC | PRN

## 2019-05-31 MED ORDER — CLINDAMYCIN PHOSPHATE 900 MG/50ML IV SOLN
INTRAVENOUS | Status: AC
  Filled 2019-05-31: qty 50

## 2019-05-31 MED ORDER — CELECOXIB 200 MG PO CAPS
200.0000 mg | ORAL_CAPSULE | Freq: Two times a day (BID) | ORAL | Status: DC
  Administered 2019-05-31 – 2019-06-01 (×2): 200 mg via ORAL
  Filled 2019-05-31 (×4): qty 1

## 2019-05-31 MED ORDER — KETOROLAC TROMETHAMINE 30 MG/ML IJ SOLN
INTRAMUSCULAR | Status: DC | PRN
  Administered 2019-05-31: 30 mg

## 2019-05-31 MED ORDER — MORPHINE SULFATE (PF) 2 MG/ML IV SOLN
0.5000 mg | INTRAVENOUS | Status: DC | PRN
  Administered 2019-06-01: 0.5 mg via INTRAVENOUS
  Filled 2019-05-31: qty 1

## 2019-05-31 MED ORDER — POVIDONE-IODINE 10 % EX SWAB
2.0000 "application " | Freq: Once | CUTANEOUS | Status: AC
  Administered 2019-05-31: 2 via TOPICAL

## 2019-05-31 MED ORDER — HYDROCODONE-ACETAMINOPHEN 5-325 MG PO TABS
1.0000 | ORAL_TABLET | ORAL | Status: DC | PRN
  Administered 2019-05-31 – 2019-06-02 (×8): 2 via ORAL
  Filled 2019-05-31 (×9): qty 2

## 2019-05-31 MED ORDER — ONDANSETRON HCL 4 MG/2ML IJ SOLN
INTRAMUSCULAR | Status: AC
  Filled 2019-05-31: qty 2

## 2019-05-31 MED ORDER — AMITRIPTYLINE HCL 25 MG PO TABS
25.0000 mg | ORAL_TABLET | Freq: Every evening | ORAL | Status: DC | PRN
  Administered 2019-06-01: 23:00:00 25 mg via ORAL
  Filled 2019-05-31 (×2): qty 1

## 2019-05-31 MED ORDER — OXYCODONE HCL 5 MG/5ML PO SOLN: 5.0000 mg | Freq: Once | ORAL | Status: AC | PRN

## 2019-05-31 MED ORDER — ISOPROPYL ALCOHOL 70 % SOLN
Status: AC
  Filled 2019-05-31: qty 480

## 2019-05-31 MED ORDER — ROCURONIUM BROMIDE 50 MG/5ML IV SOSY
PREFILLED_SYRINGE | INTRAVENOUS | Status: DC | PRN
  Administered 2019-05-31: 10 mg via INTRAVENOUS
  Administered 2019-05-31: 60 mg via INTRAVENOUS

## 2019-05-31 MED ORDER — DEXTROSE 50 % IV SOLN
25.0000 mL | Freq: Once | INTRAVENOUS | Status: AC
  Administered 2019-05-31: 25 mL via INTRAVENOUS

## 2019-05-31 MED ORDER — ROCURONIUM BROMIDE 10 MG/ML (PF) SYRINGE
PREFILLED_SYRINGE | INTRAVENOUS | Status: AC
  Filled 2019-05-31: qty 10

## 2019-05-31 MED ORDER — POLYETHYLENE GLYCOL 3350 17 G PO PACK: 17.0000 g | PACK | Freq: Every day | ORAL | Status: DC | PRN

## 2019-05-31 MED ORDER — BUPIVACAINE-EPINEPHRINE 0.25% -1:200000 IJ SOLN
INTRAMUSCULAR | Status: DC | PRN
  Administered 2019-05-31: 30 mL

## 2019-05-31 MED ORDER — DEXAMETHASONE SODIUM PHOSPHATE 10 MG/ML IJ SOLN
INTRAMUSCULAR | Status: DC | PRN
  Administered 2019-05-31: 8 mg via INTRAVENOUS

## 2019-05-31 MED ORDER — APIXABAN 2.5 MG PO TABS
2.5000 mg | ORAL_TABLET | Freq: Two times a day (BID) | ORAL | Status: DC
  Administered 2019-06-01 – 2019-06-02 (×3): 2.5 mg via ORAL
  Filled 2019-05-31 (×3): qty 1

## 2019-05-31 MED ORDER — VANCOMYCIN HCL IN DEXTROSE 1-5 GM/200ML-% IV SOLN
INTRAVENOUS | Status: AC
  Administered 2019-05-31: 1000 mg via INTRAVENOUS
  Filled 2019-05-31: qty 200

## 2019-05-31 MED ORDER — HYDROMORPHONE HCL 1 MG/ML IJ SOLN
0.2500 mg | INTRAMUSCULAR | Status: DC | PRN
  Administered 2019-05-31 (×3): 0.5 mg via INTRAVENOUS

## 2019-05-31 MED ORDER — HYDROMORPHONE HCL 1 MG/ML IJ SOLN
INTRAMUSCULAR | Status: AC
  Filled 2019-05-31: qty 1

## 2019-05-31 MED ORDER — ONDANSETRON HCL 4 MG PO TABS: 4.0000 mg | ORAL_TABLET | Freq: Four times a day (QID) | ORAL | Status: DC | PRN

## 2019-05-31 MED ORDER — ONDANSETRON HCL 4 MG/2ML IJ SOLN
4.0000 mg | Freq: Four times a day (QID) | INTRAMUSCULAR | Status: DC | PRN
  Administered 2019-05-31: 4 mg via INTRAVENOUS
  Filled 2019-05-31: qty 2

## 2019-05-31 MED ORDER — OXYCODONE HCL 5 MG PO TABS
5.0000 mg | ORAL_TABLET | Freq: Once | ORAL | Status: AC | PRN
  Administered 2019-05-31: 5 mg via ORAL

## 2019-05-31 MED ORDER — METHOCARBAMOL 500 MG IVPB - SIMPLE MED
INTRAVENOUS | Status: AC
  Filled 2019-05-31: qty 50

## 2019-05-31 MED ORDER — PROPOFOL 10 MG/ML IV BOLUS
INTRAVENOUS | Status: AC
  Filled 2019-05-31: qty 20

## 2019-05-31 MED ORDER — FENTANYL CITRATE (PF) 100 MCG/2ML IJ SOLN
INTRAMUSCULAR | Status: DC | PRN
  Administered 2019-05-31 (×2): 50 ug via INTRAVENOUS
  Administered 2019-05-31: 100 ug via INTRAVENOUS
  Administered 2019-05-31 (×3): 50 ug via INTRAVENOUS
  Administered 2019-05-31: 100 ug via INTRAVENOUS
  Administered 2019-05-31: 50 ug via INTRAVENOUS

## 2019-05-31 MED ORDER — SODIUM CHLORIDE (PF) 0.9 % IJ SOLN
INTRAMUSCULAR | Status: AC
  Filled 2019-05-31: qty 50

## 2019-05-31 MED ORDER — LACTATED RINGERS IV SOLN
INTRAVENOUS | Status: DC | PRN
  Administered 2019-05-31 (×2): via INTRAVENOUS

## 2019-05-31 MED ORDER — LIDOCAINE 2% (20 MG/ML) 5 ML SYRINGE
INTRAMUSCULAR | Status: AC
  Filled 2019-05-31: qty 5

## 2019-05-31 MED ORDER — SUCCINYLCHOLINE CHLORIDE 200 MG/10ML IV SOSY
PREFILLED_SYRINGE | INTRAVENOUS | Status: AC
  Filled 2019-05-31: qty 10

## 2019-05-31 MED ORDER — DEXTROSE 50 % IV SOLN
INTRAVENOUS | Status: AC
  Filled 2019-05-31: qty 50

## 2019-05-31 MED ORDER — MIDAZOLAM HCL 2 MG/2ML IJ SOLN
INTRAMUSCULAR | Status: AC
  Filled 2019-05-31: qty 2

## 2019-05-31 MED ORDER — ACETAMINOPHEN 10 MG/ML IV SOLN
1000.0000 mg | INTRAVENOUS | Status: DC
  Administered 2019-05-31: 1000 mg via INTRAVENOUS

## 2019-05-31 SURGICAL SUPPLY — 63 items
12/14 TAPER FEMORAL HEAD (Orthopedic Implant) ×2 IMPLANT
BAG ZIPLOCK 12X15 (MISCELLANEOUS) ×2 IMPLANT
BLADE SAW SGTL 11.0X1.19X90.0M (BLADE) IMPLANT
BONE CHIP PRESERV 30CC PCAN30 (Bone Implant) ×2 IMPLANT
CHLORAPREP W/TINT 26 (MISCELLANEOUS) ×2 IMPLANT
COVER PERINEAL POST (MISCELLANEOUS) ×2 IMPLANT
COVER SURGICAL LIGHT HANDLE (MISCELLANEOUS) ×2 IMPLANT
COVER WAND RF STERILE (DRAPES) IMPLANT
DECANTER SPIKE VIAL GLASS SM (MISCELLANEOUS) ×4 IMPLANT
DERMABOND ADVANCED (GAUZE/BANDAGES/DRESSINGS) ×2
DERMABOND ADVANCED .7 DNX12 (GAUZE/BANDAGES/DRESSINGS) ×2 IMPLANT
DRAPE IMP U-DRAPE 54X76 (DRAPES) ×2 IMPLANT
DRAPE SHEET LG 3/4 BI-LAMINATE (DRAPES) ×8 IMPLANT
DRAPE STERI IOBAN 125X83 (DRAPES) ×2 IMPLANT
DRAPE U-SHAPE 47X51 STRL (DRAPES) ×4 IMPLANT
DRESSING AQUACEL AG SP 3.5X10 (GAUZE/BANDAGES/DRESSINGS) ×1 IMPLANT
DRSG AQUACEL AG ADV 3.5X10 (GAUZE/BANDAGES/DRESSINGS) ×2 IMPLANT
DRSG AQUACEL AG SP 3.5X10 (GAUZE/BANDAGES/DRESSINGS) ×2
ELECT BLADE TIP CTD 4 INCH (ELECTRODE) ×2 IMPLANT
ELECT PENCIL ROCKER SW 15FT (MISCELLANEOUS) ×2 IMPLANT
EVACUATOR DRAINAGE 7X20 100CC (MISCELLANEOUS) IMPLANT
EVACUATOR SILICONE 100CC (MISCELLANEOUS)
GAUZE 4X4 16PLY RFD (DISPOSABLE) ×2 IMPLANT
GAUZE SPONGE 4X4 12PLY STRL (GAUZE/BANDAGES/DRESSINGS) ×2 IMPLANT
GLOVE BIO SURGEON STRL SZ8.5 (GLOVE) ×4 IMPLANT
GLOVE BIOGEL M 7.0 STRL (GLOVE) IMPLANT
GLOVE BIOGEL PI IND STRL 7.5 (GLOVE) ×1 IMPLANT
GLOVE BIOGEL PI IND STRL 8.5 (GLOVE) ×1 IMPLANT
GLOVE BIOGEL PI INDICATOR 7.5 (GLOVE) ×1
GLOVE BIOGEL PI INDICATOR 8.5 (GLOVE) ×1
GOWN SPEC L3 XXLG W/TWL (GOWN DISPOSABLE) ×2 IMPLANT
GOWN STRL REUS W/TWL XL LVL3 (GOWN DISPOSABLE) ×2 IMPLANT
HOLDER FOLEY CATH W/STRAP (MISCELLANEOUS) ×2 IMPLANT
HOOD PEEL AWAY FLYTE STAYCOOL (MISCELLANEOUS) ×8 IMPLANT
INSERT REST ADM X3 28 28/54 (Insert) ×2 IMPLANT
JET LAVAGE IRRISEPT WOUND (IRRIGATION / IRRIGATOR) ×2
KIT TURNOVER KIT A (KITS) IMPLANT
LAVAGE JET IRRISEPT WOUND (IRRIGATION / IRRIGATOR) ×1 IMPLANT
LINER MDM LINE 48MM G (Liner) ×2 IMPLANT
MARKER SKIN DUAL TIP RULER LAB (MISCELLANEOUS) ×2 IMPLANT
MULTIHOLE ACETABULAR SHELL (Orthopedic Implant) ×2 IMPLANT
NDL SAFETY ECLIPSE 18X1.5 (NEEDLE) ×1 IMPLANT
NEEDLE HYPO 18GX1.5 SHARP (NEEDLE) ×1
NEEDLE SPNL 18GX3.5 QUINCKE PK (NEEDLE) ×2 IMPLANT
PACK ANTERIOR HIP CUSTOM (KITS) ×2 IMPLANT
SAW OSC TIP CART 19.5X105X1.3 (SAW) ×2 IMPLANT
SCREW HEX LP 6.5X30 (Screw) ×4 IMPLANT
SCREW HEX LP 6.5X35 (Screw) ×2 IMPLANT
SEALER BIPOLAR AQUA 6.0 (INSTRUMENTS) ×2 IMPLANT
SHELL ACETAB MULTIHOLE 60 HIP (Miscellaneous) ×2 IMPLANT
SUT ETHIBOND NAB CT1 #1 30IN (SUTURE) ×4 IMPLANT
SUT MNCRL AB 3-0 PS2 18 (SUTURE) ×2 IMPLANT
SUT MNCRL AB 4-0 PS2 18 (SUTURE) ×4 IMPLANT
SUT MON AB 2-0 CT1 36 (SUTURE) ×4 IMPLANT
SUT STRATAFIX PDO 1 14 VIOLET (SUTURE) ×1
SUT STRATFX PDO 1 14 VIOLET (SUTURE) ×1
SUT VIC AB 2-0 CT1 27 (SUTURE) ×2
SUT VIC AB 2-0 CT1 TAPERPNT 27 (SUTURE) ×2 IMPLANT
SUTURE STRATFX PDO 1 14 VIOLET (SUTURE) ×1 IMPLANT
SYR 3ML LL SCALE MARK (SYRINGE) ×2 IMPLANT
SYR 50ML LL SCALE MARK (SYRINGE) ×2 IMPLANT
TRAY FOLEY MTR SLVR 16FR STAT (SET/KITS/TRAYS/PACK) IMPLANT
WATER STERILE IRR 1000ML POUR (IV SOLUTION) ×4 IMPLANT

## 2019-05-31 NOTE — Anesthesia Procedure Notes (Signed)
Procedure Name: Intubation Date/Time: 05/31/2019 7:45 AM Performed by: Maxwell Caul, CRNA Pre-anesthesia Checklist: Patient identified, Emergency Drugs available, Suction available and Patient being monitored Patient Re-evaluated:Patient Re-evaluated prior to induction Oxygen Delivery Method: Circle system utilized Preoxygenation: Pre-oxygenation with 100% oxygen Induction Type: IV induction Laryngoscope Size: Mac and 3 Grade View: Grade II Tube type: Oral Tube size: 7.0 mm Number of attempts: 1 Airway Equipment and Method: Stylet Placement Confirmation: ETT inserted through vocal cords under direct vision,  positive ETCO2 and breath sounds checked- equal and bilateral Secured at: 21 cm Tube secured with: Tape Dental Injury: Teeth and Oropharynx as per pre-operative assessment

## 2019-05-31 NOTE — Progress Notes (Signed)
PHARMACIST - PHYSICIAN ORDER COMMUNICATION  CONCERNING: P&T Medication Policy on Herbal Medications  DESCRIPTION:  This patient's order for:  Biotin  has been noted.  This product(s) is classified as an "herbal" or natural product. Due to a lack of definitive safety studies or FDA approval, nonstandard manufacturing practices, plus the potential risk of unknown drug-drug interactions while on inpatient medications, the Pharmacy and Therapeutics Committee does not permit the use of "herbal" or natural products of this type within [facility].   ACTION TAKEN: The pharmacy department is unable to verify this order at this time. Please reevaluate patient's clinical condition at discharge and address if the herbal or natural product(s) should be resumed at that time.  Lenis Noon, PharmD 05/31/19 11:58 AM

## 2019-05-31 NOTE — Transfer of Care (Signed)
Immediate Anesthesia Transfer of Care Note  Patient: Gail Short  Procedure(s) Performed: TOTAL HIP REVISION (Left Hip)  Patient Location: PACU  Anesthesia Type:General  Level of Consciousness: awake, alert  and oriented  Airway & Oxygen Therapy: Patient Spontanous Breathing and Patient connected to face mask oxygen  Post-op Assessment: Report given to RN and Post -op Vital signs reviewed and stable  Post vital signs: Reviewed and stable  Last Vitals:  Vitals Value Taken Time  BP 115/78 05/31/19 1039  Temp    Pulse 86 05/31/19 1043  Resp 12 05/31/19 1043  SpO2 100 % 05/31/19 1043  Vitals shown include unvalidated device data.  Last Pain:  Vitals:   05/31/19 0543  TempSrc: Oral         Complications: No apparent anesthesia complications

## 2019-05-31 NOTE — Discharge Instructions (Signed)
Dr. Rod Can Joint Replacement Specialist [facility] [address] [place], [place] [address] [phone]   TOTAL HIP REPLACEMENT POSTOPERATIVE DIRECTIONS    Hip Rehabilitation, Guidelines Following Surgery   WEIGHT BEARING Partial weight bearing with assist device as directed.  Touch down weight bearing left leg  The results of a hip operation are greatly improved after range of motion and muscle strengthening exercises. Follow all safety measures which are given to protect your hip. If any of these exercises cause increased pain or swelling in your joint, decrease the amount until you are comfortable again. Then slowly increase the exercises. Call your caregiver if you have problems or questions.   HOME CARE INSTRUCTIONS  Most of the following instructions are designed to prevent the dislocation of your new hip.  Remove items at home which could result in a fall. This includes throw rugs or furniture in walking pathways.  Continue medications as instructed at time of discharge.  You may have some home medications which will be placed on hold until you complete the course of blood thinner medication.  You may start showering once you are discharged home. Do not remove your dressing. Do not put on socks or shoes without following the instructions of your caregivers.   Sit on chairs with arms. Use the chair arms to help push yourself up when arising.  Arrange for the use of a toilet seat elevator so you are not sitting low.   Walk with walker as instructed.  You may resume a sexual relationship in one month or when given the OK by your caregiver.  Use walker as long as suggested by your caregivers.  You may put full weight on your legs and walk as much as is comfortable. Avoid periods of inactivity such as sitting longer than an hour when not asleep. This helps prevent blood clots.  You may return to work once you are cleared by Engineer, production.    Do not drive a car for 6 weeks or until released by your surgeon.  Do not drive while taking narcotics.  Wear elastic stockings for two weeks following surgery during the day but you may remove then at night.  Make sure you keep all of your appointments after your operation with all of your doctors and caregivers. You should call the office at the above phone number and make an appointment for approximately two weeks after the date of your surgery. Please pick up a stool softener and laxative for home use as long as you are requiring pain medications.  ICE to the affected hip every three hours for 30 minutes at a time and then as needed for pain and swelling. Continue to use ice on the hip for pain and swelling from surgery. You may notice swelling that will progress down to the foot and ankle.  This is normal after surgery.  Elevate the leg when you are not up walking on it.   It is important for you to complete the blood thinner medication as prescribed by your doctor.  Continue to use the breathing machine which will help keep your temperature down.  It is common for your temperature to cycle up and down following surgery, especially at night when you are not up moving around and exerting yourself.  The breathing machine keeps your lungs expanded and your temperature down.  RANGE OF MOTION AND STRENGTHENING EXERCISES  These exercises are designed to help you keep full movement of your hip joint.  Follow your caregiver's or physical therapist's instructions. Perform all exercises about fifteen times, three times per day or as directed. Exercise both hips, even if you have had only one joint replacement. These exercises can be done on a training (exercise) mat, on the floor, on a table or on a bed. Use whatever works the best and is most comfortable for you. Use music or television while you are exercising so that the exercises are a pleasant break in your day. This will make your life better with the  exercises acting as a break in routine you can look forward to.  Lying on your back, slowly slide your foot toward your buttocks, raising your knee up off the floor. Then slowly slide your foot back down until your leg is straight again.  Lying on your back spread your legs as far apart as you can without causing discomfort.  Lying on your side, raise your upper leg and foot straight up from the floor as far as is comfortable. Slowly lower the leg and repeat.  Lying on your back, tighten up the muscle in the front of your thigh (quadriceps muscles). You can do this by keeping your leg straight and trying to raise your heel off the floor. This helps strengthen the largest muscle supporting your knee.  Lying on your back, tighten up the muscles of your buttocks both with the legs straight and with the knee bent at a comfortable angle while keeping your heel on the floor.   SKILLED REHAB INSTRUCTIONS: If the patient is transferred to a skilled rehab facility following release from the hospital, a list of the current medications will be sent to the facility for the patient to continue.  When discharged from the skilled rehab facility, please have the facility set up the patient's Home Health Physical Therapy prior to being released. Also, the skilled facility will be responsible for providing the patient with their medications at time of release from the facility to include their pain medication and their blood thinner medication. If the patient is still at the rehab facility at time of the two week follow up appointment, the skilled rehab facility will also need to assist the patient in arranging follow up appointment in our office and any transportation needs.  MAKE SURE YOU:  Understand these instructions.  Will watch your condition.  Will get help right away if you are not doing well or get worse.  Pick up stool softner and laxative for home use following surgery while on pain medications. Do not  remove your dressing. The dressing is waterproof--it is OK to take showers. Continue to use ice for pain and swelling after surgery. Do not use any lotions or creams on the incision until instructed by your surgeon. Total Hip Protocol.  Information on my medicine - ELIQUIS (apixaban)  Why was Eliquis prescribed for you? Eliquis was prescribed for you to reduce the risk of blood clots forming after orthopedic surgery.    What do You need to know about Eliquis? Take your Eliquis TWICE DAILY - one tablet in the morning and one tablet in the evening with or without food.  It would be best to take the dose about the same time each day.  If you have difficulty swallowing the tablet whole please discuss with your pharmacist how to take the medication safely.  Take Eliquis exactly as prescribed by your doctor and DO NOT stop taking Eliquis without talking to the doctor who prescribed the medication.  Stopping  without other medication to take the place of Eliquis may increase your risk of developing a clot.  After discharge, you should have regular check-up appointments with your healthcare provider that is prescribing your Eliquis.  What do you do if you miss a dose? If a dose of ELIQUIS is not taken at the scheduled time, take it as soon as possible on the same day and twice-daily administration should be resumed.  The dose should not be doubled to make up for a missed dose.  Do not take more than one tablet of ELIQUIS at the same time.  Important Safety Information A possible side effect of Eliquis is bleeding. You should call your healthcare provider right away if you experience any of the following: ? Bleeding from an injury or your nose that does not stop. ? Unusual colored urine (red or dark brown) or unusual colored stools (red or black). ? Unusual bruising for unknown reasons. ? A serious fall or if you hit your head (even if there is no bleeding).  Some medicines may  interact with Eliquis and might increase your risk of bleeding or clotting while on Eliquis. To help avoid this, consult your healthcare provider or pharmacist prior to using any new prescription or non-prescription medications, including herbals, vitamins, non-steroidal anti-inflammatory drugs (NSAIDs) and supplements.  This website has more information on Eliquis (apixaban): http://www.eliquis.com/eliquis/home

## 2019-05-31 NOTE — Interval H&P Note (Signed)
History and Physical Interval Note:  05/31/2019 7:35 AM  Gail Short  has presented today for surgery, with the diagnosis of Failed left total hip.  The various methods of treatment have been discussed with the patient and family. After consideration of risks, benefits and other options for treatment, the patient has consented to  Procedure(s): TOTAL HIP REVISION (Left) as a surgical intervention.  The patient's history has been reviewed, patient examined, no change in status, stable for surgery.  I have reviewed the patient's chart and labs.  Questions were answered to the patient's satisfaction.     Hilton Cork Ramandeep Arington

## 2019-05-31 NOTE — Op Note (Signed)
OPERATIVE REPORT   05/31/2019  10:07 AM  PATIENT:  Gail Short   SURGEON:  Jonette Pesa, MD  ASSISTANT:  Skip Mayer, PA-C.   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  POSTOPERATIVE DIAGNOSIS:  Same.  PROCEDURE: Revision left total hip arthroplasty, acetabular component.  ANESTHESIA:   GETA.  ANTIBIOTICS: 900 mg clindamycin. 1 g vancomycin.  ESTIMATED BLOOD LOSS: 300 mL.  IMPLANTS: Stryker Trident 2 Tritanium multihole shell, 60 mm, G. MDM cementless liner, 48 mm, G. Smith [employer] Nephew Oxinium femoral head, 28+8 mm. Stryker MDM X3 insert, size 28/54, 48 G. 6.5 mm cancellus bone screw x3.  EXPLANTS: Smith [employer] Nephew R3 acetabular shell, 54 mm. Polyethylene liner, size 36 x 54, 20 degree lipped. 6.5 mm cancellus bone screw x1.  SPECIMENS: None.  COMPLICATIONS: None.  DISPOSITION: Stable to PACU.  SURGICAL INDICATIONS:  Gail Short is a 67 y.o. female with a diagnosis of Failed left total hip arthroplasty.  Briefly, she sustained a hip fracture that was treated with ORIF.  She went on to nonunion and required conversion to total hip arthroplasty.  This was done at an outside facility about 2 years ago.  She developed impingement and pain with daily activity.  She was noted to impinge in extension and external rotation.  She had audible clicking with ambulation.  She failed conservative management.  She was indicated for revision of the femoral component.  We discussed the risk, benefits, and alternatives.  The risks, benefits, and alternatives were discussed with the patient. There are risks associated with the surgery including, but not limited to, problems with anesthesia (death), infection, instability (giving out of the joint), dislocation, differences in leg length/angulation/rotation, fracture of bones, loosening or failure of implants, hematoma (blood accumulation) which may require surgical drainage, blood clots,  pulmonary embolism, nerve injury (foot drop and lateral thigh numbness), and blood vessel injury. The patient understands these risks and elects to proceed.  PROCEDURE IN DETAIL: The patient was identified in the holding area using 2 identifiers.  The surgical site was marked by myself.  She was taken to the operating room, and general anesthesia was induced.  Foley catheter was placed.  She was then transferred to the [facility] table.  The left hip was prepped and draped in the normal sterile surgical fashion.  Timeout was called, verifying site and site of surgery.  She did receive IV antibiotics within 60 minutes of beginning the procedure.  The direct anterior approach to the hip was performed through the Hueter interval.  Lateral femoral circumflex vessels were treated with the Auqumantys. The anterior capsule was exposed and an inverted T capsulotomy was made.   I encountered clear, straw colored joint fluid. The trunnion and femoral head were identified.  Traction was applied to the lower extremity.  Using a bone tamp, the femoral head was loosened from the trunnion.  The hip was then dislocated, and the femoral head was removed.   Acetabular exposure was achieved.  The cup was noted to be in vertical inclination with excessive anteversion.  Using the appropriate extraction device, the polyliner was removed without any difficulty.  No oxidation or failure noted.  The single 6.5 mm cancellus screw was identified and removed.  A trial liner was placed into the acetabular shell.  The cup extraction tool was then used beginning with a short blade, and then the long blade.  I was able to remove the cup with minimal bone loss.  I  then examined the native acetabulum.  There was a Paprosky 2C medial defect.  There was a contained defect in the posterior column.  Sequential reaming of the acetabulum was then performed up to a size 59 mm reamer until I was just able to engage the anterior and posterior walls.  I  then used allograft cancellus bone chips to fill the posterior column defect as well as medially.  The bone graft was impacted using a 58 mm reamer on reverse.  A 60 mm cup was then opened and impacted into place at approximately 40 degrees of abduction and 20 degrees of anteversion.  The cup achieved adequate press-fit fixation.  I augmented the fixation with 6.5 mm cancellus bone screws x3.  The final MDM liner was impacted into place.   I then turned my attention to the proximal femur.  The trunnion was intact without any damage.  I trialed a 28 +4 and then finally a +8 inner head with the appropriate MDM insert.  Leg lengths and offset were checked fluoroscopically.  The hip was dislocated and trial components were removed.  The final implants were placed, and the hip was reduced.  Fluoroscopy was used to confirm component position and leg lengths.  At 90 degrees of external rotation and full extension, the hip was stable to an anterior directed force.  There was no impingement.   The wound was copiously irrigated with Irrisept solution and normal saline using pule lavage.  Marcaine solution was injected into the periarticular soft tissue.  The wound was closed in layers using #1 Stratafix for the fascia, 2-0 Vicryl for the subcutaneous fat, 2-0 Monocryl for the deep dermal layer, 3-0 running Monocryl subcuticular stitch, and Dermabond for the skin.  Once the glue was fully dried, an Aquacell Ag dressing was applied.  The patient was transported to the recovery room in stable condition.  Sponge, needle, and instrument counts were correct at the end of the case x2.  The patient tolerated the procedure well and there were no known complications.  Please note that a surgical assistant was a medical necessity for this procedure to perform it in a safe and expeditious manner. Assistant was necessary to provide appropriate retraction of vital neurovascular structures, to prevent femoral fracture, and to allow  for anatomic placement of the prosthesis.  POSTOPERATIVE PLAN: The patient will be admitted to the hospital.  Touchdown weightbearing left lower extremity.  Routine antibiotic prophylaxis.  Begin Eliquis for DVT prophylaxis tomorrow morning.  Mobilize out of bed with physical therapy.  The patient will be discharged to home with home exercise program versus home health physical therapy.

## 2019-05-31 NOTE — Evaluation (Signed)
Physical Therapy Evaluation Patient Details Name: Gail Short MRN: [id number] DOB: 1951/03/28 Today's Date: 05/31/2019   History of Present Illness  Pt is 68 y.o female s/p Lt THA revision via ant approach on 05/31/19 with PMH significant for IBS, GERD, arthritis, and previous Lt THA in 2016.    Clinical Impression  Gail Short is a 68 y.o. female POD 0 s/p Lt THA revision ant approach. Patient reports independence with mobility at baseline. Patient is now limited by functional impairments (see PT problem list below) and requires min assist for transfers and gait with RW. Patient was able to ambulate 30 feet with RW while maintaining TDWB or NWB on Lt LE. She was limited by Lt hip pain and nausea which resolved with seated rest break. Patient instructed in exercise to facilitate ROM and circulation to manage edema. Patient will benefit from continued skilled PT interventions to address impairments and progress towards PLOF. Acute PT will follow to progress mobility and stair training in preparation for safe discharge home.      Follow Up Recommendations Home health PT;Supervision for mobility/OOB;Supervision/Assistance - 24 hour    Equipment Recommendations  None recommended by PT    Recommendations for Other Services       Precautions / Restrictions Precautions Precautions: Fall Restrictions Weight Bearing Restrictions: Yes LLE Weight Bearing: Touchdown weight bearing      Mobility  Bed Mobility Overal bed mobility: Needs Assistance Bed Mobility: Supine to Sit     Supine to sit: HOB elevated;Min assist     General bed mobility comments: pt requires min assist to transfer and scoot EOB, assist for Lt LE management and cues for sequencing to ue bed rails and press up through UE's  Transfers Overall transfer level: Needs assistance Equipment used: Rolling walker (2 wheeled) Transfers: Sit to/from Stand Sit to Stand: Min assist;From elevated surface         General transfer  comment: min assist to initiate power up, cues for safe hand placment and instructions for TDWB status on Lt LE  Ambulation/Gait Ambulation/Gait assistance: Min assist Gait Distance (Feet): 30 Feet Assistive device: Rolling walker (2 wheeled) Gait Pattern/deviations: Decreased weight shift to left;Decreased stride length;Decreased step length - right Gait velocity: decreased   General Gait Details: pt with good consistancy maintaining TDWB, pt preferring to maintain NWB at times with hop to pattern, pt limited with gait distance by Lt hip pain and nasuea secondary to reported vertigo which she has intermittently (brought on by looking down)  Stairs            Wheelchair Mobility    Modified Rankin (Stroke Patients Only)       Balance Overall balance assessment: Needs assistance   Sitting balance-Leahy Scale: Good     Standing balance support: During functional activity;Bilateral upper extremity supported Standing balance-Leahy Scale: Poor Standing balance comment: pt heavily reliant on UE support for balance            Pertinent Vitals/Pain Pain Assessment: 0-10 Pain Score: 7  Pain Location: Lt hip Pain Descriptors / Indicators: Aching;Sore Pain Intervention(s): Limited activity within patient's tolerance;Monitored during session;Ice applied    Home Living Family/patient expects to be discharged to:: Private residence Living Arrangements: Spouse/significant other Available Help at Discharge: Family Type of Home: House Home Access: Stairs to enter Entrance Stairs-Rails: None Entrance Stairs-Number of Steps: 1 Home Layout: Two level;Able to live on main level with bedroom/bathroom;Full bath on main level Home Equipment: Walker - 2 wheels;Cane - single point;Shower seat  Additional Comments: pt is considering getting a shower bench to use instead of seat; she reports he husband is able to assist her 24/7 at home    Prior Function Level of Independence:  Independent               Hand Dominance        Extremity/Trunk Assessment   Upper Extremity Assessment Upper Extremity Assessment: Overall WFL for tasks assessed    Lower Extremity Assessment Lower Extremity Assessment: LLE deficits/detail;Generalized weakness LLE: Unable to fully assess due to pain    Cervical / Trunk Assessment Cervical / Trunk Assessment: Normal  Communication   Communication: No difficulties  Cognition Arousal/Alertness: Awake/alert Behavior During Therapy: Anxious Overall Cognitive Status: Within Functional Limits for tasks assessed           General Comments      Exercises Total Joint Exercises Ankle Circles/Pumps: AROM;Seated;Both;10 reps Quad Sets: AROM;5 reps;Supine;Left   Assessment/Plan    PT Assessment Patient needs continued PT services  PT Problem List Decreased strength;Decreased balance;Pain;Decreased range of motion;Decreased mobility;Decreased activity tolerance       PT Treatment Interventions DME instruction;Functional mobility training;Balance training;Patient/family education;Modalities;Gait training;Therapeutic activities;Manual techniques;Therapeutic exercise;Stair training    PT Goals (Current goals can be found in the Care Plan section)  Acute Rehab PT Goals Patient Stated Goal: pt hopes this surgery will work for her hip PT Goal Formulation: With patient Time For Goal Achievement: 06/07/19 Potential to Achieve Goals: Good    Frequency 7X/week    AM-PAC PT "6 Clicks" Mobility  Outcome Measure Help needed turning from your back to your side while in a flat bed without using bedrails?: A Little Help needed moving from lying on your back to sitting on the side of a flat bed without using bedrails?: A Little Help needed moving to and from a bed to a chair (including a wheelchair)?: A Little Help needed standing up from a chair using your arms (e.g., wheelchair or bedside chair)?: A Little Help needed to walk  in hospital room?: A Little Help needed climbing 3-5 steps with a railing? : A Lot 6 Click Score: 17    End of Session Equipment Utilized During Treatment: Gait belt Activity Tolerance: Patient limited by fatigue;Patient limited by pain Patient left: in chair;with call bell/phone within reach;with chair alarm set Nurse Communication: Mobility status PT Visit Diagnosis: Unsteadiness on feet (R26.81);Muscle weakness (generalized) (M62.81);Other abnormalities of gait and mobility (R26.89);Difficulty in walking, not elsewhere classified (R26.2);Pain Pain - Right/Left: Left Pain - part of body: Hip    Time: 3220-2542 PT Time Calculation (min) (ACUTE ONLY): 25 min   Charges:   PT Evaluation $PT Eval Low Complexity: 1 Low PT Treatments $Gait Training: 8-22 mins        Kipp Brood, PT, DPT, [facility] Physical Therapist with [facility]  05/31/2019 3:33 PM

## 2019-05-31 NOTE — Anesthesia Postprocedure Evaluation (Signed)
Anesthesia Post Note  Patient: Gail Short  Procedure(s) Performed: TOTAL HIP REVISION (Left Hip)     Patient location during evaluation: PACU Anesthesia Type: General Level of consciousness: awake and alert and oriented Pain management: pain level controlled Vital Signs Assessment: post-procedure vital signs reviewed and stable Respiratory status: spontaneous breathing, nonlabored ventilation and respiratory function stable Cardiovascular status: blood pressure returned to baseline Postop Assessment: no apparent nausea or vomiting Anesthetic complications: no    Last Vitals:  Vitals:   05/31/19 1100 05/31/19 1115  BP: (!) 121/59 (!) 119/52  Pulse: 90 87  Resp: 20 (!) 9  Temp:  36.6 C  SpO2: 99% 99%    Last Pain:  Vitals:   05/31/19 1119  TempSrc:   PainSc: [place]

## 2019-06-01 LAB — BASIC METABOLIC PANEL
Anion gap: 5 (ref 5–15)
BUN: 6 mg/dL — ABNORMAL LOW (ref 8–23)
CO2: 24 mmol/L (ref 22–32)
Calcium: 8 mg/dL — ABNORMAL LOW (ref 8.9–10.3)
Chloride: 101 mmol/L (ref 98–111)
Creatinine, Ser: 0.47 mg/dL (ref 0.44–1.00)
GFR calc Af Amer: >60 mL/min (ref >60)
GFR calc non Af Amer: >60 mL/min (ref >60)
Glucose, Bld: 103 mg/dL — ABNORMAL HIGH (ref 70–99)
Potassium: 4.2 mmol/L (ref 3.5–5.1)
Sodium: 130 mmol/L — ABNORMAL LOW (ref 135–145)

## 2019-06-01 LAB — CBC
HCT: 26.4 % — ABNORMAL LOW (ref 36.0–46.0)
Hemoglobin: 8.8 g/dL — ABNORMAL LOW (ref 12.0–15.0)
MCH: 33 pg (ref 26.0–34.0)
MCHC: 33.3 g/dL (ref 30.0–36.0)
MCV: 98.9 fL (ref 80.0–100.0)
Platelets: 192 10*3/uL (ref 150–400)
RBC: 2.67 MIL/uL — ABNORMAL LOW (ref 3.87–5.11)
RDW: 12.9 % (ref 11.5–15.5)
WBC: 10.7 10*3/uL — ABNORMAL HIGH (ref 4.0–10.5)
nRBC: 0 % (ref 0.0–0.2)

## 2019-06-01 NOTE — Progress Notes (Signed)
    Subjective:  Patient reports pain as mild to moderate.  Denies N/V/CP/SOB.   Objective:   VITALS:   Vitals:   05/31/19 2148 06/01/19 0114 06/01/19 0500 06/01/19 0847  BP: 130/70 111/60 113/61 121/62  Pulse: 78 78 77 84  Resp: 16 16 16 18   Temp: 97.7 F (36.5 C) 98.2 F (36.8 C) 98.2 F (36.8 C) 98 F (36.7 C)  TempSrc: Oral Oral Oral   SpO2: 100% 100% 100% 100%  Weight:      Height:        NAD ABD soft Sensation intact distally Intact pulses distally Dorsiflexion/Plantar flexion intact Incision: dressing C/D/I Compartment soft   Lab Results  Component Value Date   WBC 10.7 (H) 06/01/2019   HGB 8.8 (L) 06/01/2019   HCT 26.4 (L) 06/01/2019   MCV 98.9 06/01/2019   PLT 192 06/01/2019   BMET    Component Value Date/Time   NA 130 (L) 06/01/2019 0323   K 4.2 06/01/2019 0323   CL 101 06/01/2019 0323   CO2 24 06/01/2019 0323   GLUCOSE 103 (H) 06/01/2019 0323   BUN 6 (L) 06/01/2019 0323   CREATININE 0.47 06/01/2019 0323   CALCIUM 8.0 (L) 06/01/2019 0323   GFRNONAA >60 06/01/2019 0323   GFRAA >60 06/01/2019 0323     Assessment/Plan: 1 Day Post-Op   Principal Problem:   Failed total hip arthroplasty (HCC) Active Problems:   Failed total hip arthroplasty, initial encounter ([place])   TDWB with walker DVT ppx: apixaban, SCDs, TEDS PO pain control PT/OT ABLA: asymptomatic, monitor Dispo: check am hgb, d/c home with HHPT when medically ready   Gail Short Gail Short 06/01/2019, 10:01 AM   Rod Can, MD Cell: [phone] [place] is now [facility]  [place] [address], [address], [place], [place] [address] Phone: [phone] [email or web address] Facebook  [employer]

## 2019-06-01 NOTE — TOC Transition Note (Signed)
Transition of Care [facility]) - CM/SW Discharge Note   Patient Details  Name: Gail Short MRN: [id number] Date of Birth: Apr 24, 1951  [facility]) CM/SW Contact:  Lia Hopping, [place] Phone Number: 06/01/2019, 11:19 AM   Clinical Narrative:    CSW provided a list of home health options, [facility]. Patient notified CSW she does not have a bedside commode and will need a shower bench. CSW notified nurse and orders placed. CSW made referral to [place] [facility] .    Final next level of care: [place] Barriers to Discharge: No Barriers Identified   Patient Goals and CMS Choice Patient states their goals for this hospitalization and ongoing recovery are:: to get better CMS Medicare.gov Compare Post Acute Care list provided to:: Patient Choice offered to / list presented to : Patient  Discharge Placement  Home                      Discharge Plan and Services                DME Arranged: 3-N-1, Shower stool DME Agency: [employer] Date DME Agency Contacted: 06/01/19 Time DME Agency Contacted: [id number] Representative spoke with at DME Agency: [place]: PT [place]: [facility] ([facility] [employer]) Date [place]: 06/01/19 Time [place]: 1118 Representative spoke with at [place]: [place] ([place]) Interventions     Readmission Risk Interventions No flowsheet data found.

## 2019-06-01 NOTE — Progress Notes (Signed)
Physical Therapy Treatment Patient Details Name: Gail Short MRN: [id number] DOB: May 02, 1951 Today's Date: 06/01/2019    History of Present Illness Pt is 68 y.o female s/p Lt THA revision via ant approach on 05/31/19 with PMH significant for IBS, GERD, arthritis, and previous Lt THA in 2016.    PT Comments    POD # 1 pm session Assisted with amb in hallway.  General Gait Details: tolerated an increased distance.  TTWB Assisted back to bed. General bed mobility comments: assisted back to bed with help from belt loop. Performed 10 heel slides using belt loop.  Applied ICE.  Pt plans to D/C to home tomorrow.     Follow Up Recommendations  Home health PT;Supervision for mobility/OOB;Supervision/Assistance - 24 hour     Equipment Recommendations  None recommended by PT    Recommendations for Other Services       Precautions / Restrictions Precautions Precautions: Fall Restrictions Weight Bearing Restrictions: Yes LLE Weight Bearing: Touchdown weight bearing    Mobility  Bed Mobility Overal bed mobility: Needs Assistance Bed Mobility: Sit to Supine       Sit to supine: Min assist   General bed mobility comments: assisted back to bed with help from belt loop  Transfers Overall transfer level: Needs assistance Equipment used: Rolling walker (2 wheeled) Transfers: Sit to/from Stand Sit to Stand: Min assist;From elevated surface         General transfer comment: 25% VC's on safety with turns and hand placement with stand to sit to control decend  Ambulation/Gait Ambulation/Gait assistance: Min guard;Min assist Gait Distance (Feet): 35 Feet Assistive device: Rolling walker (2 wheeled) Gait Pattern/deviations: Decreased weight shift to left;Decreased stride length;Decreased step length - right Gait velocity: decreased   General Gait Details: tolerated an increased distance.  TTWB   Stairs             Wheelchair Mobility    Modified Rankin (Stroke Patients  Only)       Balance                                            Cognition Arousal/Alertness: Awake/alert Behavior During Therapy: Anxious Overall Cognitive Status: Within Functional Limits for tasks assessed                                 General Comments: pt aware she is TTWB      Exercises      General Comments        Pertinent Vitals/Pain Pain Assessment: 0-10 Pain Score: 8  Pain Location: Lt hip Pain Descriptors / Indicators: Aching;Sore Pain Intervention(s): Monitored during session;Repositioned;Ice applied;Premedicated before session    Home Living                      Prior Function            PT Goals (current goals can now be found in the care plan section) Progress towards PT goals: Progressing toward goals    Frequency    7X/week      PT Plan Current plan remains appropriate    Co-evaluation              AM-PAC PT "6 Clicks" Mobility   Outcome Measure  Help needed turning from your back to your side while  in a flat bed without using bedrails?: A Little Help needed moving from lying on your back to sitting on the side of a flat bed without using bedrails?: A Little Help needed moving to and from a bed to a chair (including a wheelchair)?: A Little Help needed standing up from a chair using your arms (e.g., wheelchair or bedside chair)?: A Little Help needed to walk in hospital room?: A Little Help needed climbing 3-5 steps with a railing? : A Lot 6 Click Score: 17    End of Session Equipment Utilized During Treatment: Gait belt Activity Tolerance: Patient tolerated treatment well Patient left: in bed;with call bell/phone within reach;with bed alarm set Nurse Communication: Mobility status PT Visit Diagnosis: Unsteadiness on feet (R26.81);Muscle weakness (generalized) (M62.81);Other abnormalities of gait and mobility (R26.89);Difficulty in walking, not elsewhere classified (R26.2);Pain Pain -  Right/Left: Left Pain - part of body: Hip     Time: 7106-2694 PT Time Calculation (min) (ACUTE ONLY): 25 min  Charges:  $Gait Training: 8-22 mins $Therapeutic Activity: 8-22 mins                     Rica Koyanagi  PTA Acute  Rehabilitation Services Pager      [phone] Office      [phone]

## 2019-06-01 NOTE — Progress Notes (Signed)
Physical Therapy Treatment Patient Details Name: Gail Short MRN: [id number] DOB: [birth year] Today's Date: 06/01/2019    History of Present Illness Pt is 68 y.o female s/p Lt THA revision via ant approach on 05/31/19 with PMH significant for IBS, GERD, arthritis, and previous Lt THA in 2016.    PT Comments    POD # 1 am session Assisted OOB to amb.  General Gait Details: pt did well to maintain TTWB.  No nausea this session.  PAIN   Follow Up Recommendations  Home health PT;Supervision for mobility/OOB;Supervision/Assistance - 24 hour     Equipment Recommendations  None recommended by PT    Recommendations for Other Services       Precautions / Restrictions Precautions Precautions: Fall Restrictions Weight Bearing Restrictions: Yes LLE Weight Bearing: Touchdown weight bearing    Mobility  Bed Mobility Overal bed mobility: Needs Assistance Bed Mobility: Supine to Sit           General bed mobility comments: required increased time and assist also instructed how to use a belt to self assist L LE off bed  Transfers Overall transfer level: Needs assistance Equipment used: Rolling walker (2 wheeled) Transfers: Sit to/from Stand Sit to Stand: Min assist;From elevated surface         General transfer comment: min assist to initiate power up, cues for safe hand placment and instructions for TDWB status on Lt LE  Ambulation/Gait Ambulation/Gait assistance: Min assist Gait Distance (Feet): 27 Feet Assistive device: Rolling walker (2 wheeled) Gait Pattern/deviations: Decreased weight shift to left;Decreased stride length;Decreased step length - right Gait velocity: decreased   General Gait Details: pt did well to maintain TTWB.  No nausea this session.  PAIN   Stairs             Wheelchair Mobility    Modified Rankin (Stroke Patients Only)       Balance                                            Cognition Arousal/Alertness:  Awake/alert Behavior During Therapy: Anxious Overall Cognitive Status: Within Functional Limits for tasks assessed                                 General Comments: pt aware she is TTWB      Exercises      General Comments        Pertinent Vitals/Pain Pain Assessment: 0-10 Pain Score: 8  Pain Location: Lt hip Pain Descriptors / Indicators: Aching;Sore Pain Intervention(s): Monitored during session;Repositioned;Ice applied;Premedicated before session    Home Living                      Prior Function            PT Goals (current goals can now be found in the care plan section) Progress towards PT goals: Progressing toward goals    Frequency    7X/week      PT Plan Current plan remains appropriate    Co-evaluation              AM-PAC PT "6 Clicks" Mobility   Outcome Measure  Help needed turning from your back to your side while in a flat bed without using bedrails?: A Little Help needed moving from lying on  your back to sitting on the side of a flat bed without using bedrails?: A Little Help needed moving to and from a bed to a chair (including a wheelchair)?: A Little Help needed standing up from a chair using your arms (e.g., wheelchair or bedside chair)?: A Little Help needed to walk in hospital room?: A Little Help needed climbing 3-5 steps with a railing? : A Lot 6 Click Score: 17    End of Session Equipment Utilized During Treatment: Gait belt Activity Tolerance: No increased pain Patient left: in chair;with call bell/phone within reach;with chair alarm set Nurse Communication: Mobility status PT Visit Diagnosis: Unsteadiness on feet (R26.81);Muscle weakness (generalized) (M62.81);Other abnormalities of gait and mobility (R26.89);Difficulty in walking, not elsewhere classified (R26.2);Pain Pain - Right/Left: Left Pain - part of body: Hip     Time: 0923-0950 PT Time Calculation (min) (ACUTE ONLY): 27 min  Charges:   $Gait Training: 8-22 mins $Therapeutic Exercise: 8-22 mins                     Felecia Shelling  PTA Acute  Rehabilitation Services Pager      [phone] Office      [phone]

## 2019-06-02 LAB — CBC
HCT: 23.9 % — ABNORMAL LOW (ref 36.0–46.0)
Hemoglobin: 8 g/dL — ABNORMAL LOW (ref 12.0–15.0)
MCH: 33.8 pg (ref 26.0–34.0)
MCHC: 33.5 g/dL (ref 30.0–36.0)
MCV: 100.8 fL — ABNORMAL HIGH (ref 80.0–100.0)
Platelets: 169 10*3/uL (ref 150–400)
RBC: 2.37 MIL/uL — ABNORMAL LOW (ref 3.87–5.11)
RDW: 13.4 % (ref 11.5–15.5)
WBC: 9.4 10*3/uL (ref 4.0–10.5)
nRBC: 0 % (ref 0.0–0.2)

## 2019-06-02 MED ORDER — SENNA 8.6 MG PO TABS: 1.0000 | ORAL_TABLET | Freq: Two times a day (BID) | ORAL | 0 refills | Status: AC

## 2019-06-02 MED ORDER — HYDROCODONE-ACETAMINOPHEN 7.5-325 MG PO TABS: 1.0000 | ORAL_TABLET | ORAL | 0 refills | Status: AC | PRN

## 2019-06-02 MED ORDER — ONDANSETRON HCL 4 MG PO TABS: 4.0000 mg | ORAL_TABLET | Freq: Four times a day (QID) | ORAL | 0 refills | Status: AC | PRN

## 2019-06-02 MED ORDER — APIXABAN 2.5 MG PO TABS: 2.5000 mg | ORAL_TABLET | Freq: Two times a day (BID) | ORAL | 0 refills | Status: AC

## 2019-06-02 MED ORDER — DOCUSATE SODIUM 100 MG PO CAPS: 100.0000 mg | ORAL_CAPSULE | Freq: Two times a day (BID) | ORAL | 1 refills | Status: AC

## 2019-06-02 NOTE — Discharge Summary (Signed)
Physician Discharge Summary  Patient ID: Gail Short MRN: [id number] DOB/AGE: March 12, 1951 68 y.o.  Admit date: 05/31/2019 Discharge date: 06/02/2019  Admission Diagnoses:  Failed total hip arthroplasty [facility](HCC)  Discharge Diagnoses:  Principal Problem:   Failed total hip arthroplasty (HCC) Active Problems:   Failed total hip arthroplasty, initial encounter [facility])   Past Medical History:  Diagnosis Date  . Arthritis    back  . Family history of adverse reaction to anesthesia    PONV  . GERD (gastroesophageal reflux disease)   . History of hiatal hernia   . Irritable bowel syndrome (IBS)   . Pancreatitis due to biliary obstruction     Surgeries: Procedure(s): TOTAL HIP REVISION on 05/31/2019   Consultants (if any):   Discharged Condition: Improved  Hospital Course: Gail Short is an 68 y.o. female who was admitted 05/31/2019 with a diagnosis of Failed total hip arthroplasty (HCC) and went to the operating room on 05/31/2019 and underwent the above named procedures.    She was given perioperative antibiotics:  Anti-infectives (From admission, onward)   Start     Dose/Rate Route Frequency Ordered Stop   05/31/19 1830  vancomycin (VANCOCIN) IVPB 1000 mg/200 mL premix     1,000 mg 200 mL/hr over 60 Minutes Intravenous Every 12 hours 05/31/19 1151 05/31/19 1842   05/31/19 0600  clindamycin (CLEOCIN) IVPB 900 mg     900 mg 100 mL/hr over 30 Minutes Intravenous On call to O.R. 05/31/19 0535 05/31/19 0820   05/31/19 0600  vancomycin (VANCOCIN) IVPB 1000 mg/200 mL premix     1,000 mg 200 mL/hr over 60 Minutes Intravenous On call to O.R. 05/31/19 0535 05/31/19 0730   05/31/19 0542  clindamycin (CLEOCIN) 900 MG/50ML IVPB    Note to Pharmacy: Linard MillersSanchez, Alexa   : cabinet override      05/31/19 0542 05/31/19 0800    .  Touch down weight bearing left leg.  She was given sequential compression devices, early ambulation, and apixaban for DVT prophylaxis.  She benefited maximally from the  hospital stay and there were no complications.    Recent vital signs:  Vitals:   06/01/19 2214 06/02/19 0513  BP: (!) 152/69 130/61  Pulse: 92 86  Resp: 16 18  Temp: 98 F (36.7 C) 97.8 F (36.6 C)  SpO2: 100% 99%    Recent laboratory studies:  Lab Results  Component Value Date   HGB 8.0 (L) 06/02/2019   HGB 8.8 (L) 06/01/2019   HGB 12.4 05/26/2019   Lab Results  Component Value Date   WBC 9.4 06/02/2019   PLT 169 06/02/2019   Lab Results  Component Value Date   INR 0.9 05/26/2019   Lab Results  Component Value Date   NA 130 (L) 06/01/2019   K 4.2 06/01/2019   CL 101 06/01/2019   CO2 24 06/01/2019   BUN 6 (L) 06/01/2019   CREATININE 0.47 06/01/2019   GLUCOSE 103 (H) 06/01/2019    Discharge Medications:   Allergies as of 06/02/2019      Reactions   Penicillins Hives, Shortness Of Breath, Itching, Swelling   Did it involve swelling of the face/tongue/throat, SOB, or low BP? Yes Did it involve sudden or severe rash/hives, skin peeling, or any reaction on the inside of your mouth or nose? No Did you need to seek medical attention at a hospital or doctor's office? Yes When did it last happen?30+ years ago If all above answers are "NO", may proceed with cephalosporin use.  Meperidine And Related Other (See Comments)   Caused pt  violent reaction and loss of memory      Medication List    STOP taking these medications   HYDROcodone-acetaminophen 5-325 MG tablet Commonly known as: NORCO/VICODIN Replaced by: HYDROcodone-acetaminophen 7.5-325 MG tablet   traMADol 50 MG tablet Commonly known as: ULTRAM     TAKE these medications   amitriptyline 25 MG tablet Commonly known as: ELAVIL Take 25 mg by mouth at bedtime as needed (back pain).   apixaban 2.5 MG Tabs tablet Commonly known as: ELIQUIS Take 1 tablet (2.5 mg total) by mouth every 12 (twelve) hours.   Biotin 1000 MCG tablet Take 1,000 mcg by mouth daily.   docusate sodium 100 MG  capsule Commonly known as: COLACE Take 1 capsule (100 mg total) by mouth 2 (two) times daily.   Estrace 2 MG tablet Generic drug: estradiol Take 2 mg by mouth daily.   HYDROcodone-acetaminophen 7.5-325 MG tablet Commonly known as: NORCO Take 1 tablet by mouth every 4 (four) hours as needed for severe pain (pain score 7-10). Replaces: HYDROcodone-acetaminophen 5-325 MG tablet   meclizine 25 MG tablet Commonly known as: ANTIVERT Take 25 mg by mouth 3 (three) times daily as needed for dizziness.   ondansetron 4 MG tablet Commonly known as: ZOFRAN Take 1 tablet (4 mg total) by mouth every 6 (six) hours as needed for nausea.   senna 8.6 MG Tabs tablet Commonly known as: SENOKOT Take 1 tablet (8.6 mg total) by mouth 2 (two) times daily.   Vitamin D3 250 MCG (10000 UT) capsule Take 10,000 Units by mouth daily.            Durable Medical Equipment  (From admission, onward)         Start     Ordered   06/01/19 1053  For home use only DME Tub bench  Once     06/01/19 1052          Diagnostic Studies: Dg Pelvis Portable  Result Date: 05/31/2019 CLINICAL DATA:  Status post revision of left hip arthroplasty. EXAM: PORTABLE PELVIS 1-2 VIEWS COMPARISON:  Intraoperative imaging earlier today. FINDINGS: New left acetabular cup is in place with 3 anchoring screws. The left hip is located. No fracture or other acute abnormality is identified. Gas in the soft tissues from surgery noted. IMPRESSION: Status post revision of left hip replacement.  No acute finding. Electronically Signed   By: Inge Rise M.D.   On: 05/31/2019 11:01   Dg C-arm 1-60 Min-no Report  Result Date: 05/31/2019 Fluoroscopy was utilized by the requesting physician.  No radiographic interpretation.   Dg Hip Operative Unilat W Or W/o Pelvis Left  Result Date: 05/31/2019 CLINICAL DATA:  Left-sided hip replacement EXAM: OPERATIVE LEFT HIP (WITH PELVIS IF PERFORMED) 3 VIEWS TECHNIQUE: Fluoroscopic spot  image(s) were submitted for interpretation post-operatively. COMPARISON:  None. 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 Alignment appears near anatomic. There are expected postsurgical changes including subcutaneous gas and overlying soft tissue edema. IMPRESSION: Status post total hip arthroplasty on the left. Electronically Signed   By: Constance Holster M.D.   On: 05/31/2019 10:05    Disposition: Discharge disposition: 01-Home or Self Care       Discharge Instructions    Call MD / Call 911   Complete by: As directed    If you experience chest pain or shortness of breath, CALL 911 and be transported to the hospital emergency room.  If you develope a fever above 101 F, pus (white drainage) or increased drainage or redness at the wound, or calf pain, call your surgeon's office.   Constipation Prevention   Complete by: As directed    Drink plenty of fluids.  Prune juice may be helpful.  You may use a stool softener, such as Colace (over the counter) 100 mg twice a day.  Use MiraLax (over the counter) for constipation as needed.   Diet - low sodium heart healthy   Complete by: As directed    Discharge instructions   Complete by: As directed    Touch down weight bearing left leg   Driving restrictions   Complete by: As directed    No driving for 6 weeks   Increase activity slowly as tolerated   Complete by: As directed    Lifting restrictions   Complete by: As directed    No lifting for 6 weeks   TED hose   Complete by: As directed    Use stockings (TED hose) for 2 weeks on both leg(s).  You may remove them at night for sleeping.      Follow-up Information    Zehava Turski, Arlys John, MD. Schedule an appointment as soon as possible for a visit in 2 weeks.   Specialty: Orthopedic Surgery Why: For wound re-check Contact information: [address] [place] [address] [phone]        [facility] up.   Specialty: [facility] Contact information: [address] [place] [address] [place] [address] [phone]            Signed: Jonette Pesa 06/02/2019, 12:45 PM

## 2019-06-02 NOTE — Progress Notes (Signed)
Physical Therapy Treatment Patient Details Name: Kindall Swaby MRN: [id number] DOB: Jan 29, 1951 Today's Date: 06/02/2019    History of Present Illness Pt is 68 y.o female s/p Lt THA revision via ant approach on 05/31/19 with PMH significant for IBS, GERD, arthritis, and previous Lt THA in 2016.    PT Comments    POD # 2 am session Assisted with amb a greater distance in hallway.  Then returned to room to perform some TE's following HEP handout.  Instructed on proper tech, freq as well as use of ICE.   Addressed all mobility questions, discussed appropriate activity, educated on use of ICE.  Pt ready for D/C to home.   Follow Up Recommendations  Home health PT;Supervision for mobility/OOB;Supervision/Assistance - 24 hour     Equipment Recommendations  None recommended by PT    Recommendations for Other Services       Precautions / Restrictions Precautions Precautions: Fall Restrictions Weight Bearing Restrictions: Yes LLE Weight Bearing: Weight bearing as tolerated    Mobility  Bed Mobility               General bed mobility comments: OOB in recliner  Transfers                    Ambulation/Gait                 Stairs             Wheelchair Mobility    Modified Rankin (Stroke Patients Only)       Balance                                            Cognition Arousal/Alertness: Awake/alert Behavior During Therapy: Anxious Overall Cognitive Status: Within Functional Limits for tasks assessed                                 General Comments: pt aware she is TTWB      Exercises      General Comments        Pertinent Vitals/Pain Pain Assessment: 0-10 Pain Score: 3  Pain Location: Lt hip Pain Descriptors / Indicators: Aching;Sore Pain Intervention(s): Monitored during session;Repositioned    Home Living                      Prior Function            PT Goals (current goals can now  be found in the care plan section) Progress towards PT goals: Progressing toward goals    Frequency    7X/week      PT Plan Current plan remains appropriate    Co-evaluation              AM-PAC PT "6 Clicks" Mobility   Outcome Measure  Help needed turning from your back to your side while in a flat bed without using bedrails?: None Help needed moving from lying on your back to sitting on the side of a flat bed without using bedrails?: None Help needed moving to and from a bed to a chair (including a wheelchair)?: None Help needed standing up from a chair using your arms (e.g., wheelchair or bedside chair)?: None Help needed to walk in hospital room?: A Little Help needed climbing 3-5 steps with a  railing? : A Little 6 Click Score: 22    End of Session Equipment Utilized During Treatment: Gait belt Activity Tolerance: Patient tolerated treatment well Patient left: in chair Nurse Communication: (pt ready for D/C to home) PT Visit Diagnosis: Unsteadiness on feet (R26.81);Muscle weakness (generalized) (M62.81);Other abnormalities of gait and mobility (R26.89);Difficulty in walking, not elsewhere classified (R26.2);Pain Pain - Right/Left: Left Pain - part of body: Hip     Time: 0921-0946 PT Time Calculation (min) (ACUTE ONLY): 25 min  Charges:  $Gait Training: 8-22 mins $Therapeutic Exercise: 8-22 mins                     Felecia Shelling  PTA Acute  Rehabilitation Services Pager      [phone] Office      [phone]

## 2019-06-02 NOTE — Progress Notes (Signed)
    Subjective:  Patient reports pain as mild to moderate.  Denies N/V/CP/SOB.   Objective:   VITALS:   Vitals:   06/01/19 1225 06/01/19 1529 06/01/19 2214 06/02/19 0513  BP: (!) 153/59 (!) 135/56 (!) 152/69 130/61  Pulse: 96 83 92 86  Resp: 18 15 16 18   Temp: 98.5 F (36.9 C) 98.6 F (37 C) 98 F (36.7 C) 97.8 F (36.6 C)  TempSrc:  Oral    SpO2: 100% 100% 100% 99%  Weight:      Height:        NAD ABD soft Sensation intact distally Intact pulses distally Dorsiflexion/Plantar flexion intact Incision: dressing C/D/I Compartment soft   Lab Results  Component Value Date   WBC 9.4 06/02/2019   HGB 8.0 (L) 06/02/2019   HCT 23.9 (L) 06/02/2019   MCV 100.8 (H) 06/02/2019   PLT 169 06/02/2019   BMET    Component Value Date/Time   NA 130 (L) 06/01/2019 0323   K 4.2 06/01/2019 0323   CL 101 06/01/2019 0323   CO2 24 06/01/2019 0323   GLUCOSE 103 (H) 06/01/2019 0323   BUN 6 (L) 06/01/2019 0323   CREATININE 0.47 06/01/2019 0323   CALCIUM 8.0 (L) 06/01/2019 0323   GFRNONAA >60 06/01/2019 0323   GFRAA >60 06/01/2019 0323     Assessment/Plan: 2 Days Post-Op   Principal Problem:   Failed total hip arthroplasty (HCC) Active Problems:   Failed total hip arthroplasty, initial encounter ([place])   TDWB with walker DVT ppx: apixaban, SCDs, TEDS PO pain control PT/OT ABLA: asymptomatic, monitor Dispo: d/c home with HHPT   Gail Short 06/02/2019, 7:53 AM   Gail Can, MD Cell: [phone] Gail Short is now [facility]  [place] [address], [address], [place], [place] [address] Phone: [phone] [email or web address] Facebook  [employer]

## 2019-06-06 ENCOUNTER — Encounter (HOSPITAL_COMMUNITY): Payer: Self-pay | Admitting: Orthopedic Surgery

## 2019-10-07 ENCOUNTER — Ambulatory Visit: Payer: Medicare Other

## 2019-10-15 ENCOUNTER — Ambulatory Visit: Payer: Medicare Other | Attending: Internal Medicine

## 2019-10-15 DIAGNOSIS — Z23 Encounter for immunization: Secondary | ICD-10-CM | POA: Insufficient documentation

## 2019-10-15 NOTE — Progress Notes (Signed)
   Covid-19 Vaccination Clinic  Name:  Neziah Vogelgesang    MRN: [id number] DOB: [birth year]  10/15/2019  Ms. Varon was observed post Covid-19 immunization for 15 minutes without incidence. She was provided with Vaccine Information Sheet and instruction to access the V-Safe system.   Ms. Ulloa was instructed to call 911 with any severe reactions post vaccine: Marland Kitchen Difficulty breathing  . Swelling of your face and throat  . A fast heartbeat  . A bad rash all over your body  . Dizziness and weakness    Immunizations Administered    Name Date Dose VIS Date Route   Pfizer COVID-19 Vaccine 10/15/2019  8:22 AM 0.3 mL 08/18/2019 Intramuscular   Manufacturer: [employer], [employer]   Lot: [id number]   NDC: [address]-5

## 2019-10-18 ENCOUNTER — Ambulatory Visit: Payer: Medicare Other

## 2019-11-08 ENCOUNTER — Ambulatory Visit: Payer: Medicare Other | Attending: Internal Medicine

## 2019-11-08 DIAGNOSIS — Z23 Encounter for immunization: Secondary | ICD-10-CM | POA: Insufficient documentation

## 2019-11-08 NOTE — Progress Notes (Signed)
   Covid-19 Vaccination Clinic  Name:  Gail Short    MRN: [id number] DOB: [birth year]  11/08/2019  [name] was observed post Covid-19 immunization for 30 minutes based on pre-vaccination screening without incident. She was provided with Vaccine Information Sheet and instruction to access the V-Safe system.   [name] was instructed to call 911 with any severe reactions post vaccine: Marland Kitchen Difficulty breathing  . Swelling of face and throat  . A fast heartbeat  . A bad rash all over body  . Dizziness and weakness   Immunizations Administered    Name Date Dose VIS Date Route   Pfizer COVID-19 Vaccine 11/08/2019  3:35 PM 0.3 mL 08/18/2019 Intramuscular   Manufacturer: [employer], [employer]   Lot: [id number]   NDC: [address]-1

## 2020-10-14 IMAGING — NM NUCLEAR MEDICINE THREE PHASE BONE SCAN
10 series · 20 of 20 positions shown · non-contrast
Comparison: None.

CLINICAL DATA: Left hip pain.  Left hip replacement 4853.

EXAM:
NUCLEAR MEDICINE 3-PHASE BONE SCAN
TECHNIQUE: Radionuclide angiographic images, immediate static blood pool
images, and 3-hour delayed static images were obtained of the pelvis
and hips after intravenous injection of radiopharmaceutical.
RADIOPHARMACEUTICALS:  25.6 mCi Zc-99m MDP IV

[Series 1: flow · 2.07mm/px · 6 of 48 frames shown (1 of 2)]
[frame 5/48  full-range]
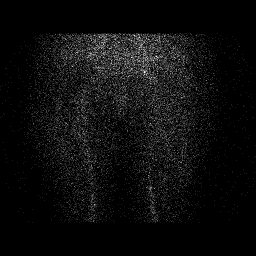
[frame 13/48  full-range]
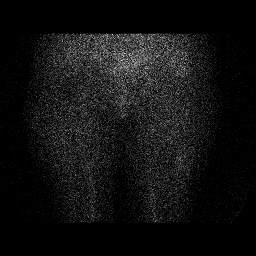
[frame 21/48  full-range]
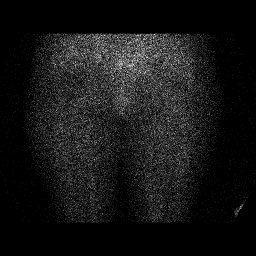
[frame 29/48  full-range]
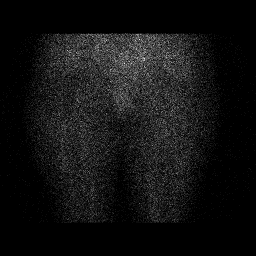
[frame 37/48  full-range]
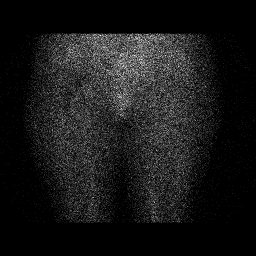
[frame 45/48  full-range]
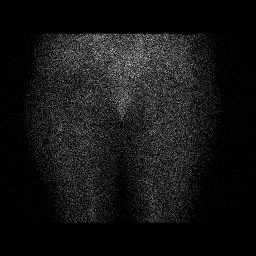

[Series 1: flow · 2.07mm/px · 6 of 48 frames shown (2 of 2)]
[frame 5/48  full-range]
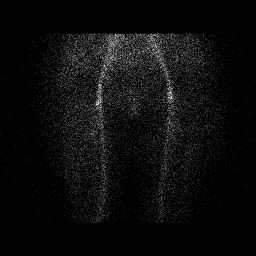
[frame 13/48  full-range]
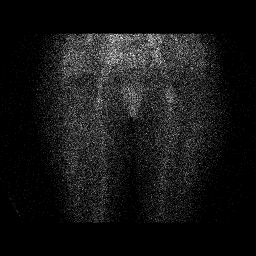
[frame 21/48  full-range]
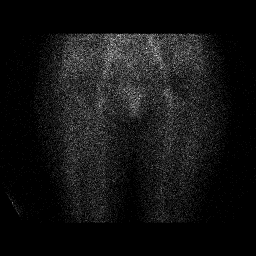
[frame 29/48  full-range]
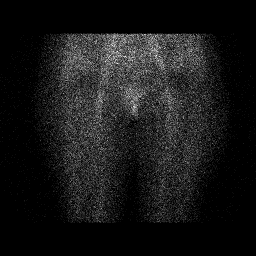
[frame 37/48  full-range]
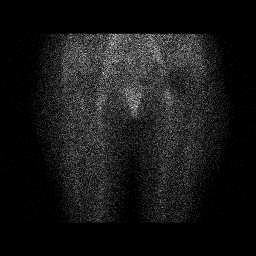
[frame 45/48  full-range]
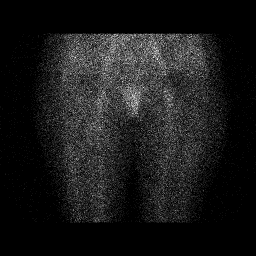

[Series 2: blood pool · 2.07mm/px · 1 of 1 slices shown (1 of 2)]
[im 1/1]
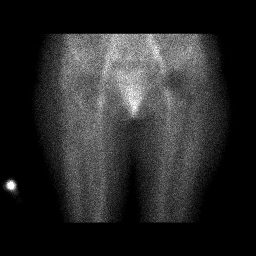

[Series 2: blood pool · 2.07mm/px · 1 of 1 slices shown (2 of 2)]
[im 1/1]
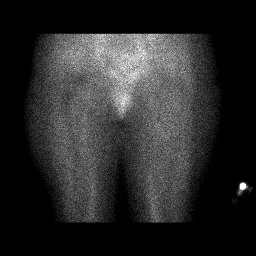

[Series 3: lat bp · 2.07mm/px · 1 of 1 slices shown (1 of 2)]
[im 1/1]
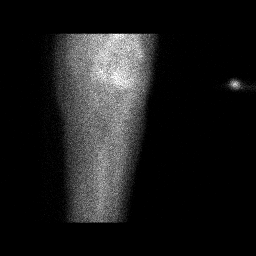

[Series 3: lat bp · 2.07mm/px · 1 of 1 slices shown (2 of 2)]
[im 1/1]
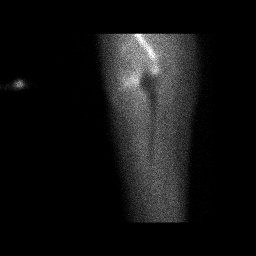

[Series 4: delay · delayed · 2.07mm/px · 1 of 1 slices shown (1 of 4)]
[im 1/1]
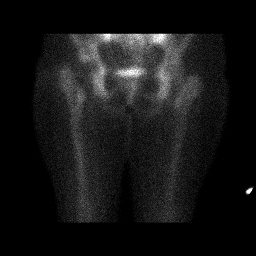

[Series 4: delay · delayed · 2.07mm/px · 1 of 1 slices shown (2 of 4)]
[im 1/1]
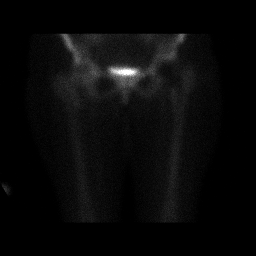

[Series 5: delay · delayed · 2.07mm/px · 1 of 1 slices shown (3 of 4)]
[im 1/1]
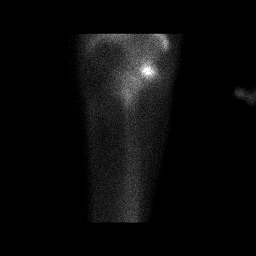

[Series 5: delay · delayed · 2.07mm/px · 1 of 1 slices shown (4 of 4)]
[im 1/1]
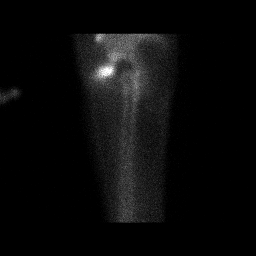

[20 of 20 positions shown; findings below may reference images not displayed]

FINDINGS: Vascular phase: Normal.

Blood pool phase: Normal.

Delayed phase: The patient is status post left hip replacement. No
abnormal uptake around the left hip prosthesis to suggest loosening.
No cause for the patient's pain identified.
IMPRESSION: No evidence of left hip replacement loosening or failure. No cause
for left-sided pain identified.

## 2020-11-16 IMAGING — DX DG PORTABLE PELVIS
1 series · 1 of 1 positions shown · non-contrast
Comparison: Intraoperative imaging earlier today.

CLINICAL DATA: Status post revision of left hip arthroplasty.

EXAM:
PORTABLE PELVIS 1-2 VIEWS

[pelvis ap]
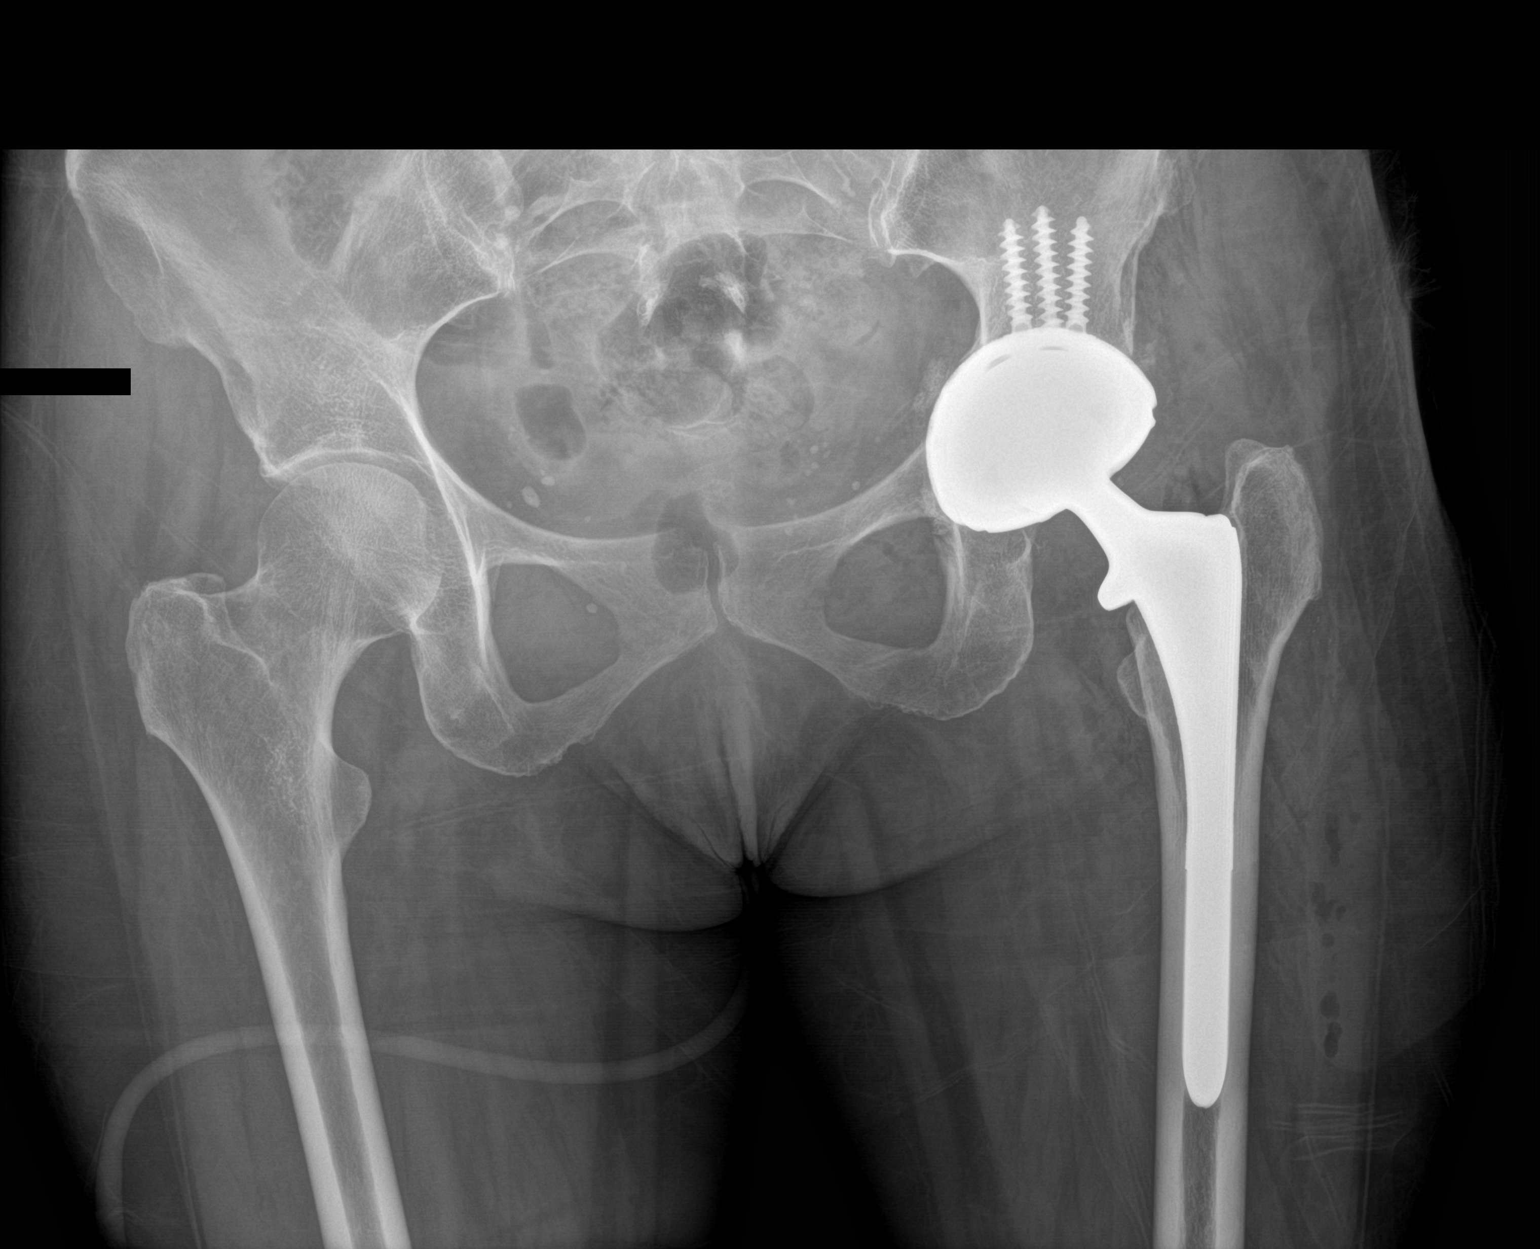

[1 of 1 positions shown; findings below may reference images not displayed]

FINDINGS: New left acetabular cup is in place with 3 anchoring screws. The
left hip is located. No fracture or other acute abnormality is
identified. Gas in the soft tissues from surgery noted.
IMPRESSION: Status post revision of left hip replacement.  No acute finding.

## 2020-11-16 IMAGING — RF DG HIP (WITH PELVIS) OPERATIVE*L*
1 series · 3 of 3 positions shown · non-contrast
Comparison: None.

CLINICAL DATA: Left-sided hip replacement

EXAM:
OPERATIVE LEFT HIP (WITH PELVIS IF PERFORMED) 3 VIEWS
TECHNIQUE: Fluoroscopic spot image(s) were submitted for interpretation
post-operatively.

[Series 1: unknown protocol · 0.20mm/px · 3 of 3 slices shown]
[im 1/3]
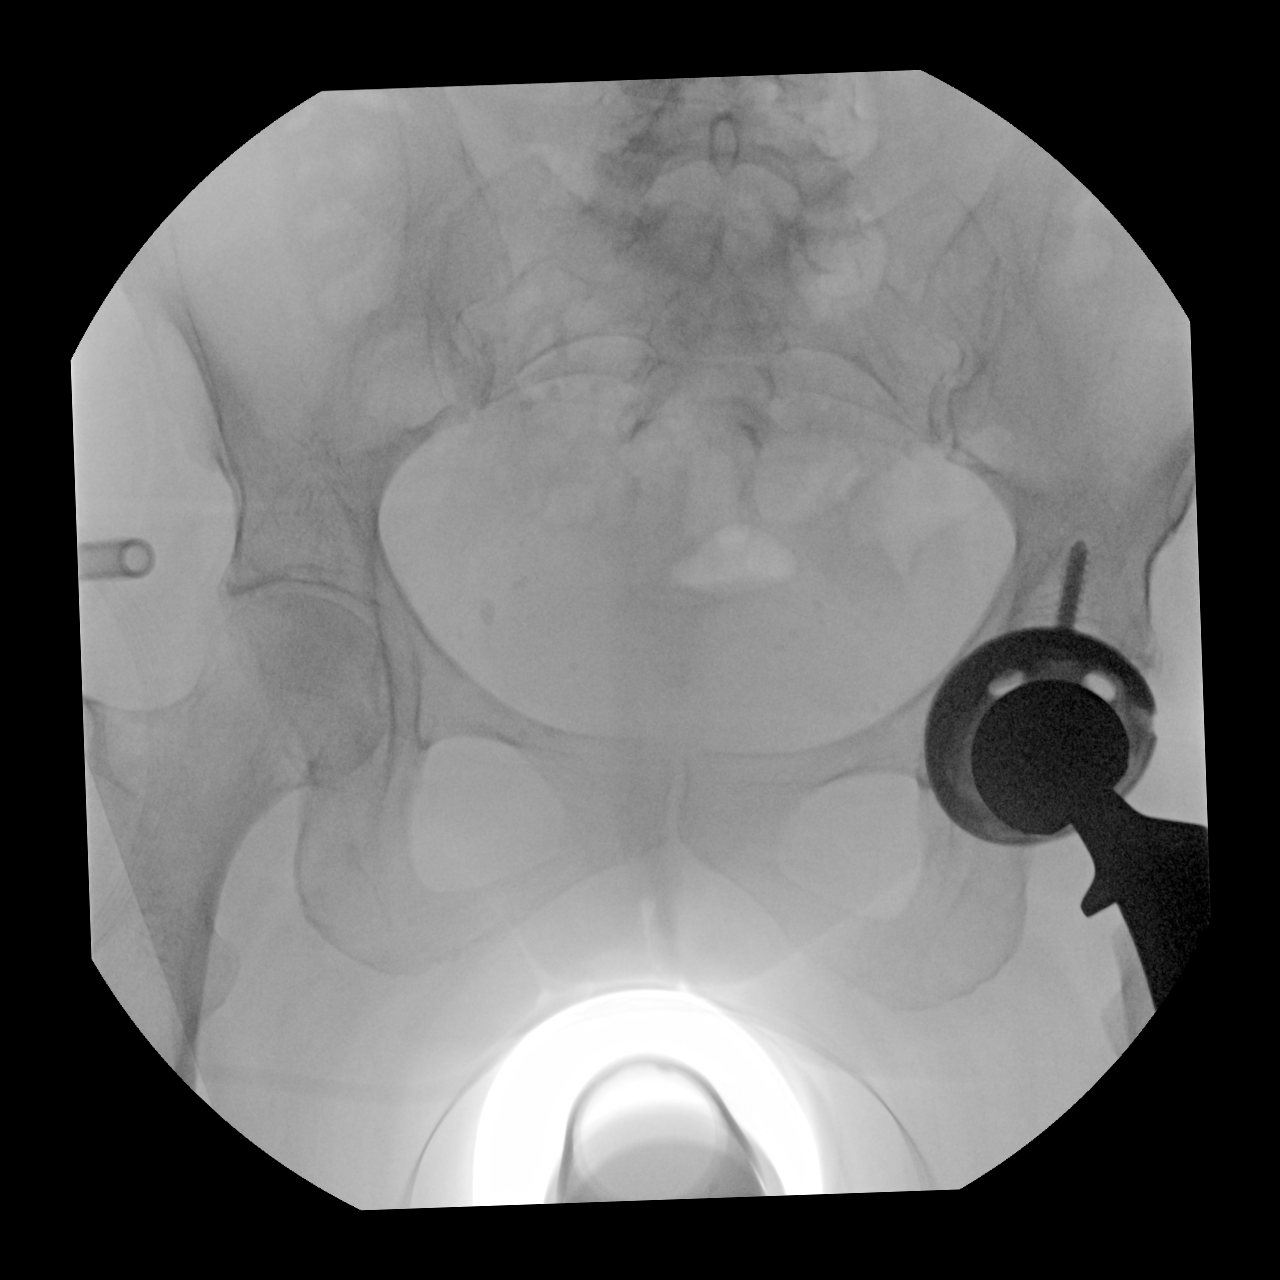
[im 2/3]
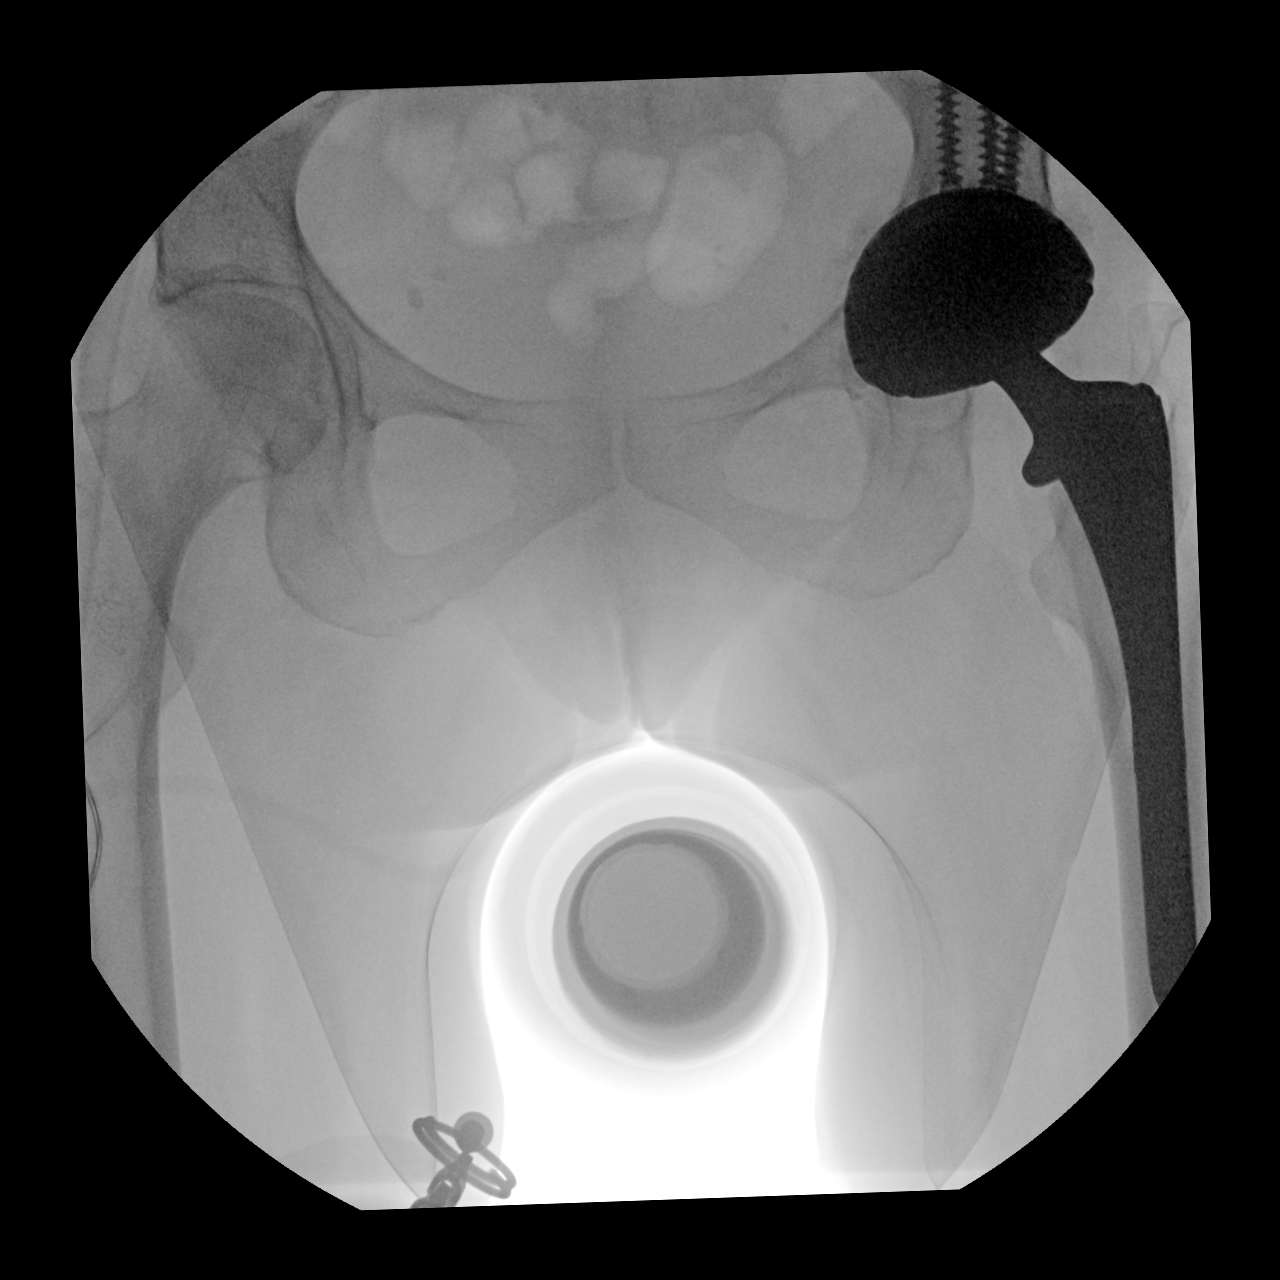
[im 3/3]
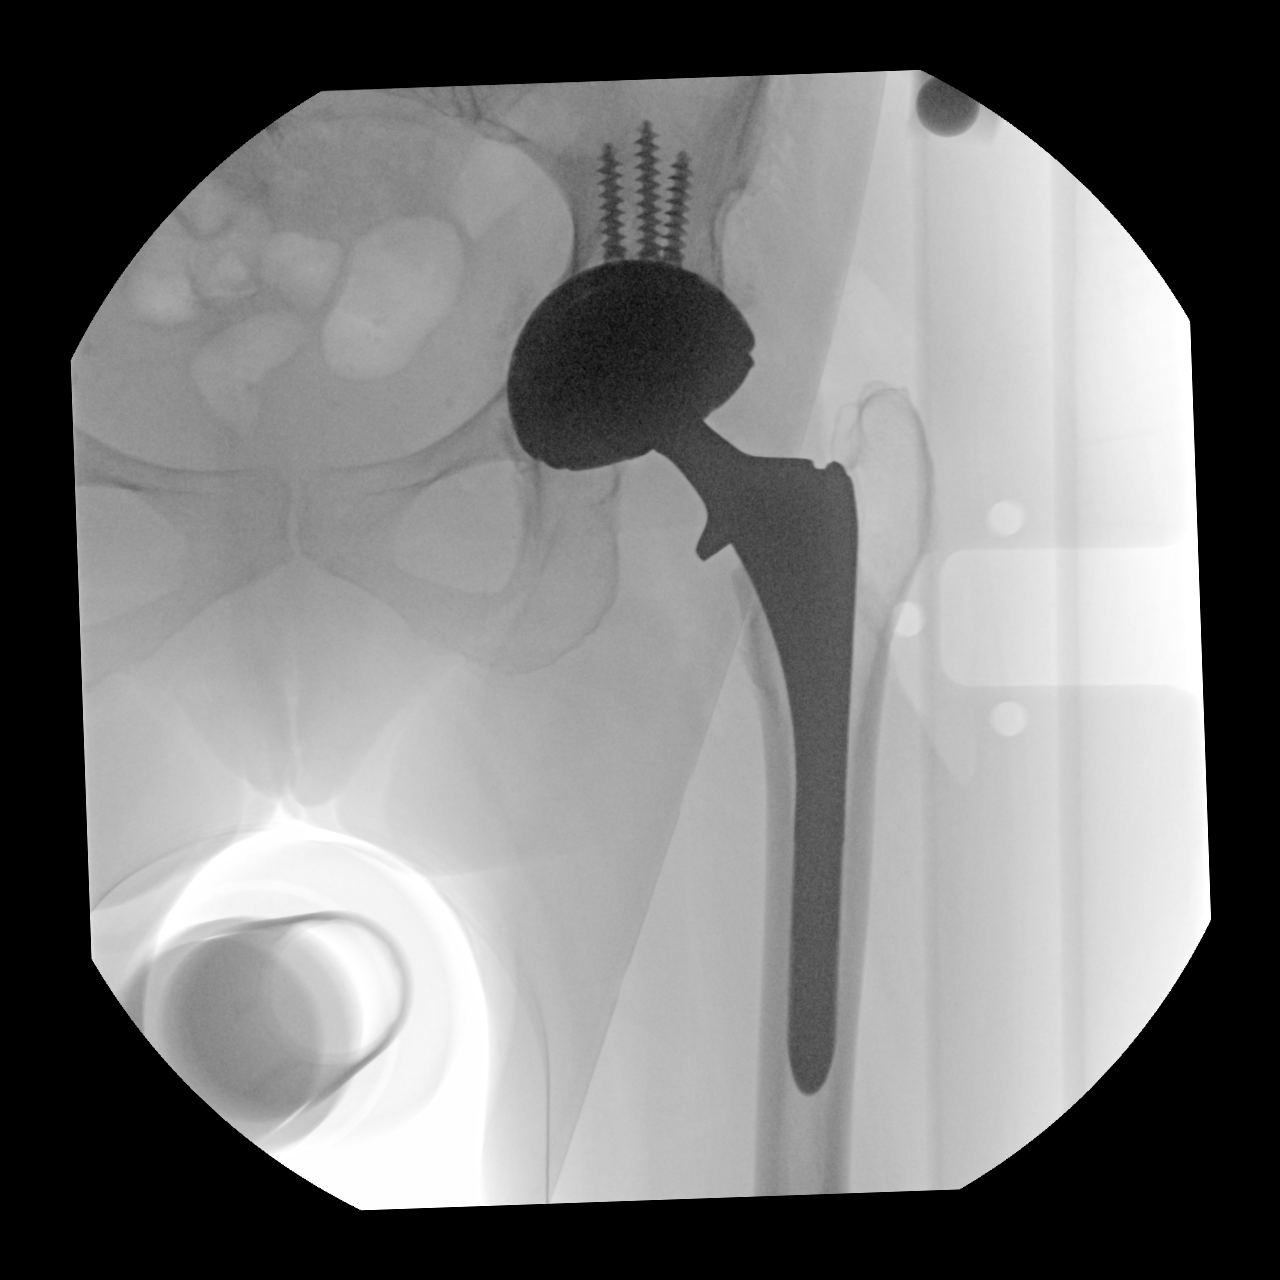

[3 of 3 positions shown; findings below may reference images not displayed]

FINDINGS: The patient has undergone total hip arthroplasty on the left.
Alignment appears near anatomic. There are expected postsurgical
changes including subcutaneous gas and overlying soft tissue edema.
IMPRESSION: Status post total hip arthroplasty on the left.
